# Patient Record
Sex: Female | Born: 1975 | Race: Black or African American | Hispanic: No | Smoking: Never smoker
Health system: Southern US, Community
[De-identification: ages and names within clinical notes are randomized; demographics above are authoritative.]

## PROBLEM LIST (undated history)

## (undated) DIAGNOSIS — N83209 Unspecified ovarian cyst, unspecified side: Secondary | ICD-10-CM

## (undated) DIAGNOSIS — R51 Headache: Secondary | ICD-10-CM

## (undated) DIAGNOSIS — K5900 Constipation, unspecified: Secondary | ICD-10-CM

## (undated) DIAGNOSIS — D649 Anemia, unspecified: Secondary | ICD-10-CM

## (undated) HISTORY — DX: Anemia, unspecified: D64.9

## (undated) HISTORY — PX: TUBAL LIGATION: SHX77

---

## 2010-11-24 ENCOUNTER — Encounter (HOSPITAL_COMMUNITY): Payer: Self-pay

## 2010-11-24 ENCOUNTER — Inpatient Hospital Stay (HOSPITAL_COMMUNITY)
Admission: AD | Admit: 2010-11-24 | Discharge: 2010-11-24 | Disposition: A | Discharge status: Home / Self Care | Payer: Self-pay | Source: Ambulatory Visit | Attending: Obstetrics & Gynecology | Admitting: Obstetrics & Gynecology

## 2010-11-24 ENCOUNTER — Inpatient Hospital Stay (HOSPITAL_COMMUNITY): Payer: Self-pay

## 2010-11-24 DIAGNOSIS — N939 Abnormal uterine and vaginal bleeding, unspecified: Secondary | ICD-10-CM

## 2010-11-24 DIAGNOSIS — N949 Unspecified condition associated with female genital organs and menstrual cycle: Secondary | ICD-10-CM | POA: Insufficient documentation

## 2010-11-24 DIAGNOSIS — N938 Other specified abnormal uterine and vaginal bleeding: Secondary | ICD-10-CM | POA: Insufficient documentation

## 2010-11-24 HISTORY — DX: Unspecified ovarian cyst, unspecified side: N83.209

## 2010-11-24 LAB — URINE MICROSCOPIC-ADD ON

## 2010-11-24 LAB — WET PREP, GENITAL
Trich, Wet Prep: NONE SEEN
Yeast Wet Prep HPF POC: NONE SEEN

## 2010-11-24 LAB — CBC
MCHC: 32.1 g/dL (ref 30.0–36.0)
RDW: 14.5 % (ref 11.5–15.5)

## 2010-11-24 LAB — URINALYSIS, ROUTINE W REFLEX MICROSCOPIC
Nitrite: NEGATIVE
Specific Gravity, Urine: 1.02 (ref 1.005–1.030)
Urobilinogen, UA: 2 mg/dL — ABNORMAL HIGH (ref 0.0–1.0)

## 2010-11-24 MED ORDER — MEDROXYPROGESTERONE ACETATE 10 MG PO TABS: 10.0000 mg | ORAL_TABLET | Freq: Every day | ORAL | Status: DC

## 2010-11-24 MED ORDER — NAPROXEN SODIUM 550 MG PO TABS: 550.0000 mg | ORAL_TABLET | Freq: Two times a day (BID) | ORAL | Status: AC

## 2010-11-24 NOTE — Progress Notes (Signed)
Pt states she had her normal period from 6-26 till 7-1. Started bleeding again on 7-15 and is heavier and brighter red blood than she normally has. Has had cramping with the bleeding. Changes tampons 4 times a day.

## 2010-11-24 NOTE — Progress Notes (Signed)
Pt reports have normal menses une 25- July 1 and now restarted bleeding on Sunday 15th and passing med size clots and cramping in low abd

## 2010-11-24 NOTE — ED Provider Notes (Addendum)
History    patient is a 35 year old black female who is a gravida 4 para 4. She does today complaining of low bowel pain and irregular vaginal bleeding. She states prior to this event that she has had normal and regular her menses. She states however at this time she started menses 2 weeks early. She also complains that the bleeding was extremely heavy and painful. She denies any vaginal discharge or irritation. She denies fever. She denies dysuria. She has no complaints this time.  Chief Complaint  Patient presents with  . Vaginal Bleeding   HPI  OB History    Grav Para Term Preterm Abortions TAB SAB Ect Mult Living   4 4 4       4       Past Medical History  Diagnosis Date  . Ovarian cyst     Past Surgical History  Procedure Date  . Tubal ligation     Family History  Problem Relation Age of Onset  . Asthma Mother   . Hypertension Mother     History  Substance Use Topics  . Smoking status: Never Smoker   . Smokeless tobacco: Not on file  . Alcohol Use: Yes    Allergies: No Known Allergies  Prescriptions prior to admission  Medication Sig Dispense Refill  . ibuprofen (ADVIL,MOTRIN) 200 MG tablet Take 400 mg by mouth every 6 (six) hours as needed. pain      . naproxen sodium (ANAPROX) 220 MG tablet Take 440 mg by mouth as needed. pain        Review of Systems  Constitutional: Negative for fever and chills.  Gastrointestinal: Positive for abdominal pain. Negative for nausea, vomiting, diarrhea and constipation.  Genitourinary: Negative for dysuria, urgency, frequency and hematuria.  Neurological: Negative for headaches.  Psychiatric/Behavioral: Negative for depression and suicidal ideas.   Physical Exam   Blood pressure 125/86, pulse 75, temperature 98.7 F (37.1 C), temperature source Oral, resp. rate 18, height 5\' 6"  (1.676 m), weight 182 lb 3.2 oz (82.645 kg), last menstrual period 11/03/2010, SpO2 99.00%.  Physical Exam  Constitutional: She is oriented to  person, place, and time. She appears well-developed and well-nourished. No distress.  HENT:  Head: Normocephalic and atraumatic.  GI: She exhibits no distension. There is no tenderness. There is no rebound and no guarding.  Genitourinary: There is bleeding around the vagina. No vaginal discharge found.       On examination there is moderate amount of bleeding in the vaginal vault. Cervix appears to be normal. Bimanual examination reveals normal size and shape uterus. No adnexal masses. She is nontender on examination.  Neurological: She is alert and oriented to person, place, and time.  Skin: Skin is warm and dry. She is not diaphoretic.  Psychiatric: She has a normal mood and affect. Her behavior is normal. Judgment and thought content normal.    MAU Course  Procedures  Results for orders placed during the hospital encounter of 11/24/10 (from the past 24 hour(s))  URINALYSIS, ROUTINE W REFLEX MICROSCOPIC     Status: Abnormal   Collection Time   11/24/10 12:55 PM      Component Value Range   Color, Urine YELLOW  YELLOW    Appearance CLOUDY (*) CLEAR    Specific Gravity, Urine 1.020  1.005 - 1.030    pH 7.0  5.0 - 8.0    Glucose, UA NEGATIVE  NEGATIVE (mg/dL)   Hgb urine dipstick LARGE (*) NEGATIVE    Bilirubin Urine NEGATIVE  NEGATIVE    Ketones, ur NEGATIVE  NEGATIVE (mg/dL)   Protein, ur NEGATIVE  NEGATIVE (mg/dL)   Urobilinogen, UA 2.0 (*) 0.0 - 1.0 (mg/dL)   Nitrite NEGATIVE  NEGATIVE    Leukocytes, UA NEGATIVE  NEGATIVE   URINE MICROSCOPIC-ADD ON     Status: Normal   Collection Time   11/24/10 12:55 PM      Component Value Range   Squamous Epithelial / LPF RARE  RARE    RBC / HPF TOO NUMEROUS TO COUNT  <3 (RBC/hpf)  CBC     Status: Abnormal   Collection Time   11/24/10  1:39 PM      Component Value Range   WBC 4.6  4.0 - 10.5 (K/uL)   RBC 3.85 (*) 3.87 - 5.11 (MIL/uL)   Hemoglobin 11.3 (*) 12.0 - 15.0 (g/dL)   HCT 40.9 (*) 81.1 - 46.0 (%)   MCV 91.4  78.0 - 100.0 (fL)     MCH 29.4  26.0 - 34.0 (pg)   MCHC 32.1  30.0 - 36.0 (g/dL)   RDW 91.4  78.2 - 95.6 (%)   Platelets 185  150 - 400 (K/uL)  WET PREP, GENITAL     Status: Normal   Collection Time   11/24/10  2:05 PM      Component Value Range   Yeast, Wet Prep NONE SEEN  NONE SEEN    Trich, Wet Prep NONE SEEN  NONE SEEN    Clue Cells, Wet Prep NONE SEEN  NONE SEEN    WBC, Wet Prep HPF POC NONE SEEN  NONE SEEN      Assessment and Plan  1) abnormal uterine bleeding: At this time we will treat her with Provera 10 mg to take 1 by mouth daily for 10 days. She'll followup with her regular GYN provider in [place]. Did discuss with her appropriate diet, activities, risks, and precautions. She understood and agreed.  Clinton Gallant. Marck Mcclenny III, DrHSc, MPAS, PA-C  11/24/2010, 3:29 PM   Henrietta Hoover, PA 12/04/10 [phone]

## 2010-11-24 NOTE — Progress Notes (Signed)
Patient is here with c/o vaginal bleeding. She states that she had a normal period june26th. She started bleeding again on Sunday. She states that it is heavier than usual (changing 4 tampons a day). She also c/o being thirsty and dizzy at times.

## 2011-03-17 ENCOUNTER — Other Ambulatory Visit (HOSPITAL_COMMUNITY)
Admission: RE | Admit: 2011-03-17 | Discharge: 2011-03-17 | Disposition: A | Discharge status: Home / Self Care | Payer: BC Managed Care – PPO | Source: Ambulatory Visit | Attending: Family Medicine | Admitting: Family Medicine

## 2011-03-17 DIAGNOSIS — Z0000 Encounter for general adult medical examination without abnormal findings: Secondary | ICD-10-CM | POA: Insufficient documentation

## 2011-03-17 DIAGNOSIS — Z113 Encounter for screening for infections with a predominantly sexual mode of transmission: Secondary | ICD-10-CM | POA: Insufficient documentation

## 2012-01-21 ENCOUNTER — Ambulatory Visit: Payer: BC Managed Care – PPO | Admitting: Family Medicine

## 2012-08-21 ENCOUNTER — Encounter (HOSPITAL_COMMUNITY): Payer: Self-pay | Admitting: *Deleted

## 2012-08-21 ENCOUNTER — Inpatient Hospital Stay (HOSPITAL_COMMUNITY)
Admission: AD | Admit: 2012-08-21 | Discharge: 2012-08-21 | Disposition: A | Discharge status: Home / Self Care | Payer: Self-pay | Source: Ambulatory Visit | Attending: Obstetrics & Gynecology | Admitting: Obstetrics & Gynecology

## 2012-08-21 DIAGNOSIS — R109 Unspecified abdominal pain: Secondary | ICD-10-CM | POA: Insufficient documentation

## 2012-08-21 DIAGNOSIS — K5900 Constipation, unspecified: Secondary | ICD-10-CM

## 2012-08-21 HISTORY — DX: Headache: R51

## 2012-08-21 HISTORY — DX: Constipation, unspecified: K59.00

## 2012-08-21 LAB — URINALYSIS, ROUTINE W REFLEX MICROSCOPIC
Glucose, UA: NEGATIVE mg/dL
Hgb urine dipstick: NEGATIVE
Specific Gravity, Urine: 1.015 (ref 1.005–1.030)

## 2012-08-21 LAB — POCT PREGNANCY, URINE: Preg Test, Ur: NEGATIVE

## 2012-08-21 MED ORDER — POLYETHYLENE GLYCOL 3350 17 G PO PACK: PACK | ORAL | Status: DC

## 2012-08-21 NOTE — MAU Note (Addendum)
Constipation X 3 months. Abdominal pain, bloating. Always "feel full." States she only has a BM when she takes a laxative. States her family doctor advised that she take miralax. Was referred to GI doctor and was told the same thing. States Miralax does not work. Feels nauseated and has HA throughout the day.

## 2012-08-21 NOTE — MAU Provider Note (Signed)
History     CSN: [id number]  Arrival date and time: 08/21/12 1144   None     Chief Complaint  Patient presents with  . Abdominal Pain  . Constipation   HPI Ongoing constipation x 3 months. Last BM 6 days ago after taking laxative tea. Has been evaluated by PCP and GI doctor. They told her to take Miralax, but she states that she took it q 30 minutes as directed, had diarrhea, then never took again because she didn't want to have diarrhea every day. Has follow up with GI doctor in four weeks.   Past Medical History  Diagnosis Date  . Ovarian cyst   . Constipation   . Headache     Past Surgical History  Procedure Laterality Date  . Tubal ligation      Family History  Problem Relation Age of Onset  . Asthma Mother   . Hypertension Mother     History  Substance Use Topics  . Smoking status: Never Smoker   . Smokeless tobacco: Not on file  . Alcohol Use: Yes     Comment: Occas.    Allergies: No Known Allergies  Prescriptions prior to admission  Medication Sig Dispense Refill  . ibuprofen (ADVIL,MOTRIN) 200 MG tablet Take 400 mg by mouth every 6 (six) hours as needed. pain      . medroxyPROGESTERone (PROVERA) 10 MG tablet Take 1 tablet (10 mg total) by mouth daily.  10 tablet  0  . naproxen sodium (ANAPROX) 220 MG tablet Take 440 mg by mouth as needed. pain        Review of Systems  Constitutional: Negative.   Respiratory: Negative.   Cardiovascular: Negative.   Gastrointestinal: Positive for abdominal pain and constipation. Negative for nausea, vomiting and diarrhea.       + bloating   Genitourinary: Negative for dysuria, urgency, frequency, hematuria and flank pain.       Negative for vaginal bleeding, vaginal discharge, dyspareunia  Musculoskeletal: Negative.   Neurological: Negative.   Psychiatric/Behavioral: Negative.    Physical Exam   Blood pressure 126/73, pulse 82, temperature 98.2 F (36.8 C), temperature source Oral, resp. rate 18, height 5'  6" (1.676 m), weight 219 lb (99.338 kg), last menstrual period 07/25/2012, SpO2 100.00%.  Physical Exam  Nursing note and vitals reviewed. Constitutional: She is oriented to person, place, and time. She appears well-developed and well-nourished. No distress.  Cardiovascular: Normal rate.   Respiratory: Effort normal.  Musculoskeletal: Normal range of motion.  Neurological: She is alert and oriented to person, place, and time.  Skin: Skin is warm and dry.  Psychiatric: She has a normal mood and affect.    MAU Course  Procedures Results for orders placed during the hospital encounter of 08/21/12 (from the past 24 hour(s))  URINALYSIS, ROUTINE W REFLEX MICROSCOPIC     Status: Abnormal   Collection Time    08/21/12 11:58 AM      Result Value Range   Color, Urine YELLOW  YELLOW   APPearance CLEAR  CLEAR   Specific Gravity, Urine 1.015  1.005 - 1.030   pH 6.5  5.0 - 8.0   Glucose, UA NEGATIVE  NEGATIVE mg/dL   Hgb urine dipstick NEGATIVE  NEGATIVE   Bilirubin Urine NEGATIVE  NEGATIVE   Ketones, ur NEGATIVE  NEGATIVE mg/dL   Protein, ur NEGATIVE  NEGATIVE mg/dL   Urobilinogen, UA 2.0 (*) 0.0 - 1.0 mg/dL   Nitrite NEGATIVE  NEGATIVE   Leukocytes, UA NEGATIVE  NEGATIVE  POCT PREGNANCY, URINE     Status: None   Collection Time    08/21/12 12:05 PM      Result Value Range   Preg Test, Ur NEGATIVE  NEGATIVE     Assessment and Plan  Chronic constipation - ongoing issue, has GI follow up, recommended taking Miralax regularly (may take today q 30 min until BM, then BID after that). Increase water, fresh fruits and vegetables, may add fiber supplement. Follow up with PCP/GI.  No emergent medical or OB/GYN concerns today.   Kamaal Cast 08/21/2012, 12:20 PM

## 2012-08-21 NOTE — MAU Provider Note (Signed)
Attestation of Attending Supervision of Advanced Practitioner (CNM/NP): Evaluation and management procedures were performed by the Advanced Practitioner under my supervision and collaboration. I have reviewed the Advanced Practitioner's note and chart, and I agree with the management and plan.  Jakaiya Netherland H. 9:51 PM   

## 2012-08-30 ENCOUNTER — Emergency Department (HOSPITAL_COMMUNITY): Payer: Self-pay

## 2012-08-30 ENCOUNTER — Encounter (HOSPITAL_COMMUNITY): Payer: Self-pay | Admitting: *Deleted

## 2012-08-30 ENCOUNTER — Emergency Department (HOSPITAL_COMMUNITY)
Admission: EM | Admit: 2012-08-30 | Discharge: 2012-08-30 | Disposition: A | Discharge status: Home / Self Care | Payer: Self-pay | Attending: Emergency Medicine | Admitting: Emergency Medicine

## 2012-08-30 DIAGNOSIS — R110 Nausea: Secondary | ICD-10-CM | POA: Insufficient documentation

## 2012-08-30 DIAGNOSIS — R51 Headache: Secondary | ICD-10-CM | POA: Insufficient documentation

## 2012-08-30 DIAGNOSIS — R109 Unspecified abdominal pain: Secondary | ICD-10-CM

## 2012-08-30 DIAGNOSIS — Z3202 Encounter for pregnancy test, result negative: Secondary | ICD-10-CM | POA: Insufficient documentation

## 2012-08-30 DIAGNOSIS — Z9851 Tubal ligation status: Secondary | ICD-10-CM | POA: Insufficient documentation

## 2012-08-30 DIAGNOSIS — Z8742 Personal history of other diseases of the female genital tract: Secondary | ICD-10-CM | POA: Insufficient documentation

## 2012-08-30 DIAGNOSIS — R1084 Generalized abdominal pain: Secondary | ICD-10-CM | POA: Insufficient documentation

## 2012-08-30 DIAGNOSIS — R5381 Other malaise: Secondary | ICD-10-CM | POA: Insufficient documentation

## 2012-08-30 DIAGNOSIS — K5900 Constipation, unspecified: Secondary | ICD-10-CM | POA: Insufficient documentation

## 2012-08-30 LAB — URINALYSIS, ROUTINE W REFLEX MICROSCOPIC
Bilirubin Urine: NEGATIVE
Hgb urine dipstick: NEGATIVE
Nitrite: NEGATIVE
Protein, ur: NEGATIVE mg/dL
Urobilinogen, UA: 0.2 mg/dL (ref 0.0–1.0)

## 2012-08-30 LAB — BASIC METABOLIC PANEL
CO2: 27 mEq/L (ref 19–32)
Chloride: 104 mEq/L (ref 96–112)
Creatinine, Ser: 0.93 mg/dL (ref 0.50–1.10)
GFR calc Af Amer: 90 mL/min — ABNORMAL LOW (ref >90)
Potassium: 4.1 mEq/L (ref 3.5–5.1)

## 2012-08-30 LAB — CBC WITH DIFFERENTIAL/PLATELET
Basophils Absolute: 0 10*3/uL (ref 0.0–0.1)
HCT: 35.8 % — ABNORMAL LOW (ref 36.0–46.0)
Hemoglobin: 11.6 g/dL — ABNORMAL LOW (ref 12.0–15.0)
Lymphocytes Relative: 27 % (ref 12–46)
Monocytes Absolute: 0.4 10*3/uL (ref 0.1–1.0)
Monocytes Relative: 7 % (ref 3–12)
Neutro Abs: 3.8 10*3/uL (ref 1.7–7.7)
RDW: 14.8 % (ref 11.5–15.5)
WBC: 5.8 10*3/uL (ref 4.0–10.5)

## 2012-08-30 LAB — PREGNANCY, URINE: Preg Test, Ur: NEGATIVE

## 2012-08-30 MED ORDER — IOHEXOL 300 MG/ML  SOLN
100.0000 mL | Freq: Once | INTRAMUSCULAR | Status: AC | PRN
  Administered 2012-08-30: 100 mL via INTRAVENOUS

## 2012-08-30 MED ORDER — LACTULOSE 10 GM/15ML PO SOLN: ORAL | Status: DC

## 2012-08-30 MED ORDER — IOHEXOL 300 MG/ML  SOLN
50.0000 mL | Freq: Once | INTRAMUSCULAR | Status: AC | PRN
  Administered 2012-08-30: 50 mL via ORAL

## 2012-08-30 NOTE — ED Notes (Signed)
Pt sts nausea and abdominal pain x 2 months.  Sts she hasn't been able to eat well, due to feeling "full."  Sts last BM x 4 days ago, after taking a laxative.

## 2012-08-30 NOTE — ED Provider Notes (Signed)
History     CSN: [id number]  Arrival date & time 08/30/12  1191   First MD Initiated Contact with Patient 08/30/12 1036      Chief Complaint  Patient presents with  . Abdominal Pain  . Nausea  . Headache  . Weakness    (Consider location/radiation/quality/duration/timing/severity/associated sxs/prior treatment) HPI Comments: Charlene Mccullough is a 37 y.o. female who presents for evaluation of persistent abdominal pain and constipation. There is intermittent over the last several months. She is seeing her doctor and has been diagnosed with constipation. She's been using MiraLax, without relief. She has mild nausea without vomiting. There's been no fever, or bloody stools. She denies back pain, weakness, or dizziness. She has not tried any medications. Recently. She has no other complaints. There are no known modifying factors  Patient is a 37 y.o. female presenting with abdominal pain, headaches, and weakness. The history is provided by the patient.  Abdominal Pain Headache Associated symptoms: abdominal pain   Weakness Associated symptoms include abdominal pain and headaches.    Past Medical History  Diagnosis Date  . Ovarian cyst   . Constipation   . Headache     Past Surgical History  Procedure Laterality Date  . Tubal ligation      Family History  Problem Relation Age of Onset  . Asthma Mother   . Hypertension Mother     History  Substance Use Topics  . Smoking status: Never Smoker   . Smokeless tobacco: Never Used  . Alcohol Use: Yes     Comment: Occas.    OB History   Grav Para Term Preterm Abortions TAB SAB Ect Mult Living   4 4 4       4       Review of Systems  Gastrointestinal: Positive for abdominal pain.  Neurological: Positive for weakness and headaches.  All other systems reviewed and are negative.    Allergies  Review of patient's allergies indicates no known allergies.  Home Medications   Current Outpatient Rx  Name  Route  Sig   Dispense  Refill  . Vitamin D, Ergocalciferol, (DRISDOL) 50000 UNITS CAPS   Oral   Take 50,000 Units by mouth every 7 (seven) days. mondays         . lactulose (CHRONULAC) 10 GM/15ML solution      15-30 cc 1 or 2 times a day for constipation.   960 mL   0     BP 120/84  Pulse 78  Temp(Src) 98 F (36.7 C) (Oral)  Resp 20  Wt 218 lb (98.884 kg)  BMI 35.2 kg/m2  SpO2 100%  LMP 08/26/2012  Physical Exam  Nursing note and vitals reviewed. Constitutional: She is oriented to person, place, and time. She appears well-developed and well-nourished.  HENT:  Head: Normocephalic and atraumatic.  Eyes: Conjunctivae and EOM are normal. Pupils are equal, round, and reactive to light.  Neck: Normal range of motion and phonation normal. Neck supple.  Cardiovascular: Normal rate, regular rhythm and intact distal pulses.   Pulmonary/Chest: Effort normal and breath sounds normal. She exhibits no tenderness.  Abdominal: Soft. She exhibits no distension and no mass. There is tenderness (mild diffuse tenderness). There is no rebound and no guarding.  Musculoskeletal: Normal range of motion.  Neurological: She is alert and oriented to person, place, and time. She has normal strength. She exhibits normal muscle tone.  Skin: Skin is warm and dry.  Psychiatric: She has a normal mood and affect. Her behavior  is normal. Judgment and thought content normal.    ED Course  Procedures (including critical care time)  Medications  iohexol (OMNIPAQUE) 300 MG/ML solution 50 mL (50 mLs Oral Contrast Given 08/30/12 1301)  iohexol (OMNIPAQUE) 300 MG/ML solution 100 mL (100 mLs Intravenous Contrast Given 08/30/12 1346)    12:25-KUB, abnormal, CT abdomen and pelvis ordered to rule out complicating factors associated with free  surgical clip coming in the abdomen  Reevaluation: 15:30- states that she feels better. She has voided after the CAT scan. Scan ordered to evaluate for urinary retention. Scan showed 107  ml   Labs Reviewed  CBC WITH DIFFERENTIAL - Abnormal; Notable for the following:    Hemoglobin 11.6 (*)    HCT 35.8 (*)    All other components within normal limits  BASIC METABOLIC PANEL - Abnormal; Notable for the following:    GFR calc non Af Amer 78 (*)    GFR calc Af Amer 90 (*)    All other components within normal limits  URINALYSIS, ROUTINE W REFLEX MICROSCOPIC  AMYLASE  LIPASE, BLOOD  PREGNANCY, URINE   Dg Abd 1 View  08/30/2012  *RADIOLOGY REPORT*  Clinical Data: Abdominal pain.  ABDOMEN - 1 VIEW  Comparison: None.  Findings: Bowel gas pattern is unremarkable.  Tubal ligation clip is seen in the left hemi pelvis.  A second clip is identified in the right upper quadrant of the abdomen.  No focal bony abnormality.  IMPRESSION:  1.  No acute finding. 2.  Tubal ligation clip on the right appears to have become dislodged and is now located in the right upper quadrant of the abdomen.   Original Report Authenticated By: Holley Dexter, M.D.    Ct Abdomen Pelvis W Contrast  08/30/2012  *RADIOLOGY REPORT*  Clinical Data: Lower abdominal pain.  CT ABDOMEN AND PELVIS WITH CONTRAST  Technique:  Multidetector CT imaging of the abdomen and pelvis was performed following the standard protocol during bolus administration of intravenous contrast.  Contrast: 50mL OMNIPAQUE IOHEXOL 300 MG/ML  SOLN, OMNIPAQUE IOHEXOL 300 MG/ML  SOLN  Comparison: Radiographs 08/30/2012.  Findings: The lung bases are clear.  No pleural effusion or pulmonary nodule.  The heart is within normal limits in size.  No pericardial effusion.  The distal esophagus is unremarkable.  The liver is unremarkable.  No focal hepatic lesions or intrahepatic biliary dilatation.  The gallbladder is normal.  No common bile duct dilatation.  The pancreas is normal.  The spleen is normal.  The adrenal glands are normal.  There is mild to moderate bilateral hydroureteronephrosis without obvious cause.  No obstructing ureteral calculi.  No  bladder calculi.  No compressive mass or adenopathy.  The bladder is mildly distended.  No mesenteric or retroperitoneal mass or adenopathy.  The aorta is normal.  The branch vessels are normal.  The stomach, duodenum, small bowel and colon are unremarkable.  No inflammatory changes or mass lesions.  The appendix is normal.  The uterus and ovaries are normal.  No pelvic mass, adenopathy or free pelvic fluid collections.  No inguinal mass or hernia. There is a tubal ligation clip noted near the left ovary.  The right tubal ligation clip is noted in the lower right abdomen near the inferior tip of the liver.  The bony structures are unremarkable.  IMPRESSION:  1.  No acute abdominal/pelvic findings, mass lesions or adenopathy. 2.  Bilateral hydroureteronephrosis and mildly dilated bladder.  No ureteral obstruction is identified. 3.  Right tubal  ligation clip has migrated into the lower right abdomen.  It is in the paracolic gutter along the inferior tip of the liver.   Original Report Authenticated By: Rudie Meyer, M.D.    Nursing Notes Reviewed/ Care Coordinated, and agree without changes. Applicable Imaging Reviewed.  Interpretation of Laboratory Data incorporated into ED treatment  1. Abdominal pain   2. Constipation       MDM  Nonspecific abdominal pain, with constipation. Incidental finding of a migrated surgical clip from the right BTL. This could indicate failure of the Ligation site. Patient was informed of this finding verbally, vitamin E, and instructed that she would be at risk for pregnancy if she had unprotected intercourse. Doubt metabolic instability, serious bacterial infection or impending vascular collapse; the patient is stable for discharge.    Plan: Home Medications- lactulose for constipation; Home Treatments- rest, fluids; Recommended follow up- GYN followup regarding migrated BTL surgical clip. Protected sex, to avoid pregnancy       Flint Melter, MD 08/30/12 [phone]

## 2012-08-30 NOTE — ED Notes (Signed)
Bladder scan showed 107 mL urine; pt up to urinate after bladder scan.

## 2012-08-30 NOTE — ED Notes (Signed)
Pt from home with reports of intermittent abdominal pain that is worse at night as well as general weakness, headaches and nausea for the last 2 months.

## 2012-08-30 NOTE — Progress Notes (Signed)
Pt confirms pcp is Writer  EPIC updated

## 2012-08-30 NOTE — ED Notes (Signed)
[id number]<AV> Expected date:<BR> Expected time:<BR> Means of arrival:<BR> Comments:<BR> Hold for triage

## 2012-10-26 ENCOUNTER — Encounter (HOSPITAL_COMMUNITY): Payer: Self-pay | Admitting: Emergency Medicine

## 2012-10-26 ENCOUNTER — Emergency Department (HOSPITAL_COMMUNITY)
Admission: EM | Admit: 2012-10-26 | Discharge: 2012-10-26 | Disposition: A | Discharge status: Home / Self Care | Payer: No Typology Code available for payment source | Attending: Emergency Medicine | Admitting: Emergency Medicine

## 2012-10-26 DIAGNOSIS — S4980XA Other specified injuries of shoulder and upper arm, unspecified arm, initial encounter: Secondary | ICD-10-CM | POA: Insufficient documentation

## 2012-10-26 DIAGNOSIS — Y92410 Unspecified street and highway as the place of occurrence of the external cause: Secondary | ICD-10-CM | POA: Insufficient documentation

## 2012-10-26 DIAGNOSIS — Z8742 Personal history of other diseases of the female genital tract: Secondary | ICD-10-CM | POA: Insufficient documentation

## 2012-10-26 DIAGNOSIS — Y9389 Activity, other specified: Secondary | ICD-10-CM | POA: Insufficient documentation

## 2012-10-26 DIAGNOSIS — S46909A Unspecified injury of unspecified muscle, fascia and tendon at shoulder and upper arm level, unspecified arm, initial encounter: Secondary | ICD-10-CM | POA: Insufficient documentation

## 2012-10-26 DIAGNOSIS — S139XXA Sprain of joints and ligaments of unspecified parts of neck, initial encounter: Secondary | ICD-10-CM | POA: Insufficient documentation

## 2012-10-26 DIAGNOSIS — S161XXA Strain of muscle, fascia and tendon at neck level, initial encounter: Secondary | ICD-10-CM

## 2012-10-26 MED ORDER — NAPROXEN 500 MG PO TABS: 500.0000 mg | ORAL_TABLET | Freq: Two times a day (BID) | ORAL | Status: DC | PRN

## 2012-10-26 MED ORDER — METHOCARBAMOL 500 MG PO TABS
500.0000 mg | ORAL_TABLET | Freq: Once | ORAL | Status: AC
  Administered 2012-10-26: 500 mg via ORAL
  Filled 2012-10-26: qty 1

## 2012-10-26 MED ORDER — IBUPROFEN 800 MG PO TABS
800.0000 mg | ORAL_TABLET | Freq: Once | ORAL | Status: AC
  Administered 2012-10-26: 800 mg via ORAL
  Filled 2012-10-26: qty 1

## 2012-10-26 MED ORDER — METHOCARBAMOL 750 MG PO TABS: 750.0000 mg | ORAL_TABLET | Freq: Four times a day (QID) | ORAL | Status: DC | PRN
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
## 2012-10-26 NOTE — ED Provider Notes (Signed)
Medical screening examination/treatment/procedure(s) were performed by non-physician practitioner and as supervising physician I was immediately available for consultation/collaboration.  Lyanne Co, MD 10/26/12 [phone]

## 2013-05-28 ENCOUNTER — Ambulatory Visit (INDEPENDENT_AMBULATORY_CARE_PROVIDER_SITE_OTHER): Payer: No Typology Code available for payment source | Admitting: Gynecology

## 2013-05-28 ENCOUNTER — Encounter: Payer: Self-pay | Admitting: Gynecology

## 2013-05-28 ENCOUNTER — Other Ambulatory Visit (HOSPITAL_COMMUNITY)
Admission: RE | Admit: 2013-05-28 | Discharge: 2013-05-28 | Disposition: A | Discharge status: Home / Self Care | Payer: No Typology Code available for payment source | Source: Ambulatory Visit | Attending: Gynecology | Admitting: Gynecology

## 2013-05-28 VITALS — BP 124/82 | Ht 66.0 in | Wt 226.0 lb

## 2013-05-28 DIAGNOSIS — Z1151 Encounter for screening for human papillomavirus (HPV): Secondary | ICD-10-CM | POA: Insufficient documentation

## 2013-05-28 DIAGNOSIS — Z309 Encounter for contraceptive management, unspecified: Secondary | ICD-10-CM

## 2013-05-28 DIAGNOSIS — Z01419 Encounter for gynecological examination (general) (routine) without abnormal findings: Secondary | ICD-10-CM

## 2013-05-28 DIAGNOSIS — Z23 Encounter for immunization: Secondary | ICD-10-CM

## 2013-05-28 DIAGNOSIS — N920 Excessive and frequent menstruation with regular cycle: Secondary | ICD-10-CM | POA: Insufficient documentation

## 2013-05-28 LAB — CBC WITH DIFFERENTIAL/PLATELET
Basophils Absolute: 0 10*3/uL (ref 0.0–0.1)
Basophils Relative: 0 % (ref 0–1)
EOS ABS: 0.1 10*3/uL (ref 0.0–0.7)
EOS PCT: 1 % (ref 0–5)
HEMATOCRIT: 34.4 % — AB (ref 36.0–46.0)
Hemoglobin: 10.7 g/dL — ABNORMAL LOW (ref 12.0–15.0)
LYMPHS ABS: 2.4 10*3/uL (ref 0.7–4.0)
LYMPHS PCT: 29 % (ref 12–46)
MCH: 25.2 pg — AB (ref 26.0–34.0)
MCHC: 31.1 g/dL (ref 30.0–36.0)
MCV: 81.1 fL (ref 78.0–100.0)
MONO ABS: 0.8 10*3/uL (ref 0.1–1.0)
Monocytes Relative: 10 % (ref 3–12)
Neutro Abs: 5 10*3/uL (ref 1.7–7.7)
Neutrophils Relative %: 60 % (ref 43–77)
PLATELETS: 280 10*3/uL (ref 150–400)
RBC: 4.24 MIL/uL (ref 3.87–5.11)
RDW: 16.7 % — ABNORMAL HIGH (ref 11.5–15.5)
WBC: 8.3 10*3/uL (ref 4.0–10.5)

## 2013-05-28 LAB — COMPREHENSIVE METABOLIC PANEL
ALBUMIN: 4.1 g/dL (ref 3.5–5.2)
ALT: 11 U/L (ref 0–35)
AST: 17 U/L (ref 0–37)
Alkaline Phosphatase: 67 U/L (ref 39–117)
BUN: 16 mg/dL (ref 6–23)
CALCIUM: 8.8 mg/dL (ref 8.4–10.5)
CHLORIDE: 103 meq/L (ref 96–112)
CO2: 25 meq/L (ref 19–32)
CREATININE: 0.79 mg/dL (ref 0.50–1.10)
GLUCOSE: 70 mg/dL (ref 70–99)
POTASSIUM: 4.1 meq/L (ref 3.5–5.3)
Sodium: 138 mEq/L (ref 135–145)
Total Bilirubin: 0.3 mg/dL (ref 0.3–1.2)
Total Protein: 6.9 g/dL (ref 6.0–8.3)

## 2013-05-28 LAB — CHOLESTEROL, TOTAL: Cholesterol: 176 mg/dL (ref 0–200)

## 2013-05-28 MED ORDER — DOXYCYCLINE HYCLATE 100 MG PO CAPS
100.0000 mg | ORAL_CAPSULE | Freq: Two times a day (BID) | ORAL | Status: DC
Start: 2013-05-28 — End: 2013-06-28

## 2013-05-28 NOTE — Addendum Note (Signed)
Addended by: Su Grand A on: 05/28/2013 03:27 PM   Modules accepted: Orders

## 2013-05-28 NOTE — Progress Notes (Addendum)
Charlene Mccullough [birth year] [id number]   History:    38 y.o.  for annual gyn exam was patient in the practice. Patient stated she has not had a Pap smear in over 2 years. She denies any past history of abnormal Pap smears or any STDs. Patient has not been using any form of contraception although she stated several years ago in [place] she had a laparoscopic tubal ligation. In April 2014 she had gone to the emergency room complaining of lower, pain and had a CT scan which essentially had only demonstrated bilateral hydro-utero nephrosis and mildly dilated bladder but no ureteral obstruction. An incidental finding and demonstrated that a right tubal ligation clip had migrated to the lower right abdomen it was in the paracolic gutter along the inferior tip of the liver. The left clip was noted near the ovary. Patient is concerned about her weight gain. She also suffers from heavy cycles with passage of large clots but no intermenstrual bleeding.  Past medical history,surgical history, family history and social history were all reviewed and documented in the EPIC chart.  Gynecologic History Patient's last menstrual period was 05/02/2013. Contraception: tubal ligation Last Pap: Over 2 years ago. Results were: Patient stated normal Last mammogram: Not indicated. Results were: Not indicated  Obstetric History OB History  Gravida Para Term Preterm AB SAB TAB Ectopic Multiple Living  4 4 4       4     # Outcome Date GA Lbr Len/2nd Weight Sex Delivery Anes PTL Lv  4 TRM           3 TRM           2 TRM           1 TRM                ROS: A ROS was performed and pertinent positives and negatives are included in the history.  GENERAL: No fevers or chills. HEENT: No change in vision, no earache, sore throat or sinus congestion. NECK: No pain or stiffness. CARDIOVASCULAR: No chest pain or pressure. No palpitations. PULMONARY: No shortness of breath, cough or wheeze. GASTROINTESTINAL: No abdominal  pain, nausea, vomiting or diarrhea, melena or bright red blood per rectum. GENITOURINARY: No urinary frequency, urgency, hesitancy or dysuria. MUSCULOSKELETAL: No joint or muscle pain, no back pain, no recent trauma. DERMATOLOGIC: No rash, no itching, no lesions. ENDOCRINE: No polyuria, polydipsia, no heat or cold intolerance. No recent change in weight. HEMATOLOGICAL: No anemia or easy bruising or bleeding. NEUROLOGIC: No headache, seizures, numbness, tingling or weakness. PSYCHIATRIC: No depression, no loss of interest in normal activity or change in sleep pattern.     Exam: chaperone present  BP 124/82  Ht 5\' 6"  (1.676 m)  Wt 226 lb (102.513 kg)  BMI 36.49 kg/m2  LMP 05/02/2013  Body mass index is 36.49 kg/(m^2).  General appearance : Well developed well nourished female. No acute distress HEENT: Neck supple, trachea midline, no carotid bruits, no thyroidmegaly Lungs: Clear to auscultation, no rhonchi or wheezes, or rib retractions  Heart: Regular rate and rhythm, no murmurs or gallops Breast:Examined in sitting and supine position were symmetrical in appearance, no palpable masses or tenderness,  no skin retraction, no nipple inversion, no nipple discharge, no skin discoloration, no axillary or supraclavicular lymphadenopathy Abdomen: no palpable masses or tenderness, no rebound or guarding Extremities: no edema or skin discoloration or tenderness  Pelvic:  Bartholin, Urethra, Skene Glands: Within normal limits  Vagina: No gross lesions or discharge  Cervix: No gross lesions or discharge  Uterus  anteverted upper limits of normal, normal size, shape and consistency, non-tender and mobile  Adnexa  Without masses or tenderness  Anus and perineum  normal   Rectovaginal  normal sphincter tone without palpated masses or tenderness             Hemoccult not indicated     Assessment/Plan:  38 y.o. female for annual exam who is concerned about her weight gain and wanted to go  under some form of treatment to help her reduced weight. We discussed the importance of regular exercise as well as healthy nutrition. We will check her TSH today along with a comprehensive metabolic panel CBC screen cholesterol and urinalysis. A Pap smear with HPV screen will be done today. 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 We did discuss today the Mirena IUD not only for contraception but her cycle control and literature and information was provided. She will be placed on Vibramycin 100 mg twice a day for 3 days starting the day before the HSG. She'll return back to the office in one month for followup meanwhile she will use barrier contraception. We will hold off on starting her on phentermine until we know that she is sterile or is using some form of contraception like Mirena IUD. Patient received the flu vaccine today.  Note: This dictation was prepared with  Dragon/digital dictation along withSmart phrase technology. Any transcriptional errors that result from this process are unintentional.   Terrance Mass MD, 3:02 PM 05/28/2013

## 2013-05-28 NOTE — Patient Instructions (Signed)
Influenza Vaccine (Flu Vaccine, Inactivated or Recombinant) 2014 2015 What You Need to Know Why get vaccinated? Influenza ("flu") is a contagious disease that spreads around the [place] every winter, usually between October and May. Flu is caused by influenza viruses, and is spread mainly by coughing, sneezing, and close contact. Anyone can get flu, but the risk of getting flu is highest among children. Symptoms come on suddenly and may last several days. They can include:  fever/chills  sore throat  muscle aches  fatigue.  cough  headache  runny or stuffy nose Flu can make some people much sicker than others. These people include young children, people 42 and older, pregnant women, and people with certain health conditions such as heart, lung or kidney disease, nervous system disorders, or a weakened immune system. Flu vaccination is especially important for these people, and anyone in close contact with them. Flu can also lead to pneumonia, and make existing medical conditions worse. It can cause diarrhea and seizures in children. Each year thousands of people in the [place] die from flu, and many more are hospitalized. Flu vaccine is the best protection against flu and its complications. Flu vaccine also helps prevent spreading flu from person to person. Inactivated and recombinant flu vaccines You are getting an injectable flu vaccine, which is either an "inactivated" or "recombinant" vaccine. These vaccines do not contain any live influenza virus. They are given by injection with a needle, and often called the "flu shot."  A different live, attenuated (weakened) influenza vaccine is sprayed into the nostrils. This vaccine is described in a separate Vaccine Information Statement. Flu vaccination is recommended every year. Some children 6 months through 36 years of age might need two doses during one year. Flu viruses are always changing. Each year's flu vaccine is made to  protect against 3 or 4 viruses that are likely to cause disease that year. Flu vaccine cannot prevent all cases of flu, but it is the best defense against the disease.  It takes about 2 weeks for protection to develop after the vaccination, and protection lasts several months to a year. Some illnesses that are not caused by influenza virus are often mistaken for flu. Flu vaccine will not prevent these illnesses. It can only prevent influenza. Some inactivated flu vaccine contains a very small amount of a mercury-based preservative called thimerosal. Studies have shown that thimerosal in vaccines is not harmful, but flu vaccines that do not contain a preservative are available. Some people should not get this vaccine Tell the person who gives you the vaccine:  If you have any severe, life-threatening allergies. If you ever had a life-threatening allergic reaction after a dose of flu vaccine, or have a severe allergy to any part of this vaccine, including (for example) an allergy to gelatin, antibiotics, or eggs, you may be advised not to get vaccinated. Most, but not all, types of flu vaccine contain a small amount of egg protein.  If you ever had Guillain-Barr Syndrome (a severe paralyzing illness, also called GBS). Some people with a history of GBS should not get this vaccine. This should be discussed with your doctor.  If you are not feeling well. It is usually okay to get flu vaccine when you have a mild illness, but you might be advised to wait until you feel better. You should come back when you are better. Risks of a vaccine reaction With a vaccine, like any medicine, there is a chance of side effects. These  are usually mild and go away on their own. Problems that could happen after any vaccine:  Brief fainting spells can happen after any medical procedure, including vaccination. Sitting or lying down for about 15 minutes can help prevent fainting, and injuries caused by a fall. Tell your  doctor if you feel dizzy, or have vision changes or ringing in the ears.  Severe shoulder pain and reduced range of motion in the arm where a shot was given can happen, very rarely, after a vaccination.  Severe allergic reactions from a vaccine are very rare, estimated at less than 1 in a million doses. If one were to occur, it would usually be within a few minutes to a few hours after the vaccination. Mild problems following inactivated flu vaccine:  soreness, redness, or swelling where the shot was given  hoarseness  sore, red or itchy eyes  cough  fever  aches  headache  itching  fatigue If these problems occur, they usually begin soon after the shot and last 1 or 2 days. Moderate problems following inactivated flu vaccine:  Young children who get inactivated flu vaccine and pneumococcal vaccine (PCV13) at the same time may be at increased risk for seizures caused by fever. Ask your doctor for more information. Tell your doctor if a child who is getting flu vaccine has ever had a seizure. Inactivated flu vaccine does not contain live flu virus, so you cannot get the flu from this vaccine. As with any medicine, there is a very remote chance of a vaccine causing a serious injury or death. The safety of vaccines is always being monitored. For more information, visit: http://www.aguilar.org/ What if there is a serious reaction? What should I look for?  Look for anything that concerns you, such as signs of a severe allergic reaction, very high fever, or behavior changes. Signs of a severe allergic reaction can include hives, swelling of the face and throat, difficulty breathing, a fast heartbeat, dizziness, and weakness. These would start a few minutes to a few hours after the vaccination. What should I do?  If you think it is a severe allergic reaction or other emergency that can't wait, call 9 1 1  and get the person to the nearest hospital. Otherwise, call your  doctor.  Afterward, the reaction should be reported to the Vaccine Adverse Event Reporting System (VAERS). Your doctor should file this report, or you can do it yourself through the VAERS website at www.vaers.SamedayNews.es, or by calling (519)727-0280. VAERS does not give medical advice. The National Vaccine Injury Compensation Program The National Vaccine Injury Compensation Program (VICP) is a federal program that was created to compensate people who may have been injured by certain vaccines. Persons who believe they may have been injured by a vaccine can learn about the program and about filing a claim by calling 731 325 7673 or visiting the [place] website at GoldCloset.com.ee. There is a time limit to file a claim for compensation. How can I learn more?  Ask your health care provider.  Call your local or state health department.  Contact the Centers for Disease Control and Prevention (CDC):  Call 520-499-5291 (1-800-CDC-INFO) or  Visit CDC's website at https://gibson.com/ CDC Vaccine Information Statement (Interim) Inactivated Influenza Vaccine (12/26/2012) Document Released: 02/18/2006 Document Revised: 02/14/2013 Document Reviewed: 12/26/2012 [facility] Patient Information 2014 [place]. Hysterosalpingography Hysterosalpingography is a procedure to look inside your uterus and fallopian tubes. During this procedure, contrast dye is injected into your uterus through your vagina and cervix to illuminate your  uterus while X-ray pictures are taken. This procedure may help your health care provider determine whether you have uterine tumors, adhesions, or structural abnormalities. It is commonly used to help determine why a woman is unable to have children (infertility). The procedure usually lasts about 15 30 minutes. LET [facility] CARE PROVIDER KNOW ABOUT:  Any allergies you have.  All medicines you are taking, including vitamins, herbs, eye drops, creams, and  over-the-counter medicines.  Previous problems you or members of your family have had with the use of anesthetics.  Any blood disorders you have.  Previous surgeries you have had.  Medical conditions you have. RISKS AND COMPLICATIONS  Generally, this is a safe procedure. However, as with any procedure, complications can occur. Possible complications include:  Infection in the lining of the uterus (endometritis) or fallopian tubes (salpingitis).  Damage or perforation of the uterus or fallopian tubes.  An allergic reaction to the contrast dye used to perform the X-ray. BEFORE THE PROCEDURE   Schedule the procedure after your period stops, but before your next ovulation. This is usually between day 5 and day 10 of your last period. Day 1 is the first day of your period.  Ask your health care provider about changing or stopping your regular medicines.  You may eat and drink as normal.  Empty your bladder before the procedure begins. PROCEDURE  You may be given a medicine to relax you (sedative) or an over-the-counter pain medicine to lessen any discomfort during the procedure.  You will lie down on an X-ray table with your feet in stirrups.  A device called a speculum will be placed into your vagina. This allows your health care provider to see inside your vagina to the cervix.  The cervix will be washed with a special soap.  A thin, flexible tube will be passed through the cervix into the uterus.  Contrast dye will be put into this tube.  Several X-rays will be taken as the contrast dye spreads through the uterus and fallopian tubes.  The tube will be taken out after the procedure. AFTER THE PROCEDURE   Most of the contrast dye will flow out of your vagina naturally. You may want to wear a sanitary pad.  You may feel mild cramping.  Ask when your test results will be ready. Make sure you get your test results. Document Released: 05/29/2004 Document Revised: 12/27/2012  Document Reviewed: 10/27/2012 [facility] Patient Information 2014 [place].

## 2013-05-29 ENCOUNTER — Other Ambulatory Visit: Payer: Self-pay | Admitting: Gynecology

## 2013-05-29 DIAGNOSIS — D649 Anemia, unspecified: Secondary | ICD-10-CM

## 2013-05-29 LAB — URINALYSIS W MICROSCOPIC + REFLEX CULTURE
BACTERIA UA: NONE SEEN
Bilirubin Urine: NEGATIVE
CASTS: NONE SEEN
Glucose, UA: NEGATIVE mg/dL
Hgb urine dipstick: NEGATIVE
Ketones, ur: NEGATIVE mg/dL
LEUKOCYTES UA: NEGATIVE
NITRITE: NEGATIVE
PH: 6 (ref 5.0–8.0)
Protein, ur: NEGATIVE mg/dL
SPECIFIC GRAVITY, URINE: 1.028 (ref 1.005–1.030)
UROBILINOGEN UA: 1 mg/dL (ref 0.0–1.0)

## 2013-05-29 LAB — TSH: TSH: 1.064 u[IU]/mL (ref 0.350–4.500)

## 2013-06-04 ENCOUNTER — Telehealth: Payer: Self-pay | Admitting: Gynecology

## 2013-06-04 ENCOUNTER — Other Ambulatory Visit: Payer: Self-pay | Admitting: Gynecology

## 2013-06-04 ENCOUNTER — Telehealth: Payer: Self-pay | Admitting: *Deleted

## 2013-06-04 DIAGNOSIS — N971 Female infertility of tubal origin: Secondary | ICD-10-CM

## 2013-06-04 DIAGNOSIS — Z3049 Encounter for surveillance of other contraceptives: Secondary | ICD-10-CM

## 2013-06-04 MED ORDER — LEVONORGESTREL 20 MCG/24HR IU IUD: INTRAUTERINE_SYSTEM | Freq: Once | INTRAUTERINE | Status: DC

## 2013-06-04 NOTE — Telephone Encounter (Signed)
06/04/13-LM VM for pt to inform her that her [employer] ins covers the Mirena & insertion at 100%. Pt to call first day of cycle to schedule/WL

## 2013-06-04 NOTE — Telephone Encounter (Signed)
Pt called requesting HSG scheduled, I called pt back and left on voicemail I need first day of LMP.

## 2013-06-05 ENCOUNTER — Other Ambulatory Visit: Payer: Self-pay | Admitting: Gynecology

## 2013-06-05 DIAGNOSIS — IMO0002 Reserved for concepts with insufficient information to code with codable children: Secondary | ICD-10-CM

## 2013-06-05 NOTE — Telephone Encounter (Signed)
Appointment on 06/08/13 @ 2:00 pm at [facility] hospital, left message for pt to call.

## 2013-06-05 NOTE — Telephone Encounter (Signed)
Pt informed with the below note, informed no sex and how to take vibramycin

## 2013-06-08 ENCOUNTER — Ambulatory Visit (HOSPITAL_COMMUNITY)
Admission: RE | Admit: 2013-06-08 | Discharge: 2013-06-08 | Disposition: A | Discharge status: Home / Self Care | Payer: No Typology Code available for payment source | Source: Ambulatory Visit | Attending: Gynecology | Admitting: Gynecology

## 2013-06-08 DIAGNOSIS — Z9851 Tubal ligation status: Secondary | ICD-10-CM | POA: Insufficient documentation

## 2013-06-08 DIAGNOSIS — IMO0002 Reserved for concepts with insufficient information to code with codable children: Secondary | ICD-10-CM

## 2013-06-08 MED ORDER — IOHEXOL 300 MG/ML  SOLN
20.0000 mL | Freq: Once | INTRAMUSCULAR | Status: AC | PRN
  Administered 2013-06-08: 20 mL

## 2013-06-12 ENCOUNTER — Telehealth: Payer: Self-pay

## 2013-06-12 NOTE — Telephone Encounter (Signed)
She will need to come into the office for consultation and to start her on a medication

## 2013-06-12 NOTE — Telephone Encounter (Signed)
Patient said you were going to prescribe something to help her lose weight.  At visit you wrote "We will hold off on starting her on phentermine until we know that she is sterile or is using some form of contraception like Mirena IUD. "  Her HSG did show that she is sterilized.

## 2013-06-12 NOTE — Telephone Encounter (Signed)
Patient informed. Transferred to appts to schedule.

## 2013-06-28 ENCOUNTER — Ambulatory Visit (INDEPENDENT_AMBULATORY_CARE_PROVIDER_SITE_OTHER): Payer: No Typology Code available for payment source | Admitting: Gynecology

## 2013-06-28 ENCOUNTER — Encounter: Payer: Self-pay | Admitting: Gynecology

## 2013-06-28 VITALS — BP 122/78

## 2013-06-28 DIAGNOSIS — N760 Acute vaginitis: Secondary | ICD-10-CM

## 2013-06-28 DIAGNOSIS — N920 Excessive and frequent menstruation with regular cycle: Secondary | ICD-10-CM

## 2013-06-28 DIAGNOSIS — R635 Abnormal weight gain: Secondary | ICD-10-CM

## 2013-06-28 DIAGNOSIS — B9689 Other specified bacterial agents as the cause of diseases classified elsewhere: Secondary | ICD-10-CM

## 2013-06-28 DIAGNOSIS — Z113 Encounter for screening for infections with a predominantly sexual mode of transmission: Secondary | ICD-10-CM

## 2013-06-28 DIAGNOSIS — D649 Anemia, unspecified: Secondary | ICD-10-CM

## 2013-06-28 DIAGNOSIS — A499 Bacterial infection, unspecified: Secondary | ICD-10-CM

## 2013-06-28 DIAGNOSIS — N898 Other specified noninflammatory disorders of vagina: Secondary | ICD-10-CM

## 2013-06-28 LAB — WET PREP FOR TRICH, YEAST, CLUE
TRICH WET PREP: NONE SEEN
Yeast Wet Prep HPF POC: NONE SEEN

## 2013-06-28 MED ORDER — TINIDAZOLE 500 MG PO TABS: ORAL_TABLET | ORAL | Status: DC

## 2013-06-28 MED ORDER — PHENTERMINE HCL 37.5 MG PO CAPS: 37.5000 mg | ORAL_CAPSULE | ORAL | Status: DC

## 2013-06-28 NOTE — Patient Instructions (Addendum)
Phentermine tablets or capsules What is this medicine? PHENTERMINE (FEN ter meen) decreases your appetite. It is used with a reduced calorie diet and exercise to help you lose weight. This medicine may be used for other purposes; ask your health care provider or pharmacist if you have questions. COMMON BRAND NAME(S): Adipex-P, Atti-Plex P , Atti-Plex P Spansule , Fastin, Pro-Fast, Tara-8  What should I tell my health care provider before I take this medicine? They need to know if you have any of these conditions: -agitation -glaucoma -heart disease -high blood pressure -history of substance abuse -lung disease called Primary Pulmonary Hypertension (PPH) -taken an MAOI like Carbex, Eldepryl, Marplan, Nardil, or Parnate in last 14 days -thyroid disease -an unusual or allergic reaction to phentermine, other medicines, foods, dyes, or preservatives -pregnant or trying to get pregnant -breast-feeding How should I use this medicine? Take this medicine by mouth with a glass of water. Follow the directions on the prescription label. This medicine is usually taken 30 minutes before or 1 to 2 hours after breakfast. Avoid taking this medicine in the evening. It may interfere with sleep. Take your doses at regular intervals. Do not take your medicine more often than directed. Talk to your pediatrician regarding the use of this medicine in children. Special care may be needed. Overdosage: If you think you have taken too much of this medicine contact a poison control center or emergency room at once. NOTE: This medicine is only for you. Do not share this medicine with others. What if I miss a dose? If you miss a dose, take it as soon as you can. If it is almost time for your next dose, take only that dose. Do not take double or extra doses. What may interact with this medicine? Do not take this medicine with any of the following medications: -duloxetine -MAOIs  like Carbex, Eldepryl, Marplan, Nardil, and Parnate -medicines for colds or breathing difficulties like pseudoephedrine or phenylephrine -procarbazine -sibutramine -SSRIs like citalopram, escitalopram, fluoxetine, fluvoxamine, paroxetine, and sertraline -stimulants like dexmethylphenidate, methylphenidate or modafinil -venlafaxine This medicine may also interact with the following medications: -medicines for diabetes This list may not describe all possible interactions. Give your health care provider a list of all the medicines, herbs, non-prescription drugs, or dietary supplements you use. Also tell them if you smoke, drink alcohol, or use illegal drugs. Some items may interact with your medicine. What should I watch for while using this medicine? Notify your physician immediately if you become short of breath while doing your normal activities. Do not take this medicine within 6 hours of bedtime. It can keep you from getting to sleep. Avoid drinks that contain caffeine and try to stick to a regular bedtime every night. This medicine was intended to be used in addition to a healthy diet and exercise. The best results are achieved this way. This medicine is only indicated for short-term use. Eventually your weight loss may level out. At that point, the drug will only help you maintain your new weight. Do not increase or in any way change your dose without consulting your doctor. You may get drowsy or dizzy. Do not drive, use machinery, or do anything that needs mental alertness until you know how this medicine affects you. Do not stand or sit up quickly, especially if you are an older patient. This reduces the risk of dizzy or fainting spells. Alcohol may increase dizziness and drowsiness. Avoid alcoholic drinks. What side effects may I notice from receiving this medicine?  Side effects that you should report to your doctor or health care professional as soon as possible: -chest pain,  palpitations -depression or severe changes in mood -increased blood pressure -irritability -nervousness or restlessness -severe dizziness -shortness of breath -problems urinating -unusual swelling of the legs -vomiting Side effects that usually do not require medical attention (report to your doctor or health care professional if they continue or are bothersome): -blurred vision or other eye problems -changes in sexual ability or desire -constipation or diarrhea -difficulty sleeping -dry mouth or unpleasant taste -headache -nausea This list may not describe all possible side effects. Call your doctor for medical advice about side effects. You may report side effects to FDA at 1-800-FDA-1088. Where should I keep my medicine? Keep out of the reach of children. This medicine can be abused. Keep your medicine in a safe place to protect it from theft. Do not share this medicine with anyone. Selling or giving away this medicine is dangerous and against the law. Store at room temperature between 20 and 25 degrees C (68 and 77 degrees F). Keep container tightly closed. Throw away any unused medicine after the expiration date. NOTE: This sheet is a summary. It may not cover all possible information. If you have questions about this medicine, talk to your doctor, pharmacist, or health care provider.  2014, Elsevier/Gold Standard. (2010-06-10 11:02:44)   Bacterial Vaginosis Bacterial vaginosis is a vaginal infection that occurs when the normal balance of bacteria in the vagina is disrupted. It results from an overgrowth of certain bacteria. This is the most common vaginal infection in women of childbearing age. Treatment is important to prevent complications, especially in pregnant women, as it can cause a premature delivery. CAUSES  Bacterial vaginosis is caused by an increase in harmful bacteria that are normally present in smaller amounts in the vagina. Several different kinds of bacteria can  cause bacterial vaginosis. However, the reason that the condition develops is not fully understood. RISK FACTORS Certain activities or behaviors can put you at an increased risk of developing bacterial vaginosis, including:  Having a new sex partner or multiple sex partners.  Douching.  Using an intrauterine device (IUD) for contraception. Women do not get bacterial vaginosis from toilet seats, bedding, swimming pools, or contact with objects around them. SIGNS AND SYMPTOMS  Some women with bacterial vaginosis have no signs or symptoms. Common symptoms include:  Grey vaginal discharge.  A fishlike odor with discharge, especially after sexual intercourse.  Itching or burning of the vagina and vulva.  Burning or pain with urination. DIAGNOSIS  Your health care provider will take a medical history and examine the vagina for signs of bacterial vaginosis. A sample of vaginal fluid may be taken. Your health care provider will look at this sample under a microscope to check for bacteria and abnormal cells. A vaginal pH test may also be done.  TREATMENT  Bacterial vaginosis may be treated with antibiotic medicines. These may be given in the form of a pill or a vaginal cream. A second round of antibiotics may be prescribed if the condition comes back after treatment.  HOME CARE INSTRUCTIONS   Only take over-the-counter or prescription medicines as directed by your health care provider.  If antibiotic medicine was prescribed, take it as directed. Make sure you finish it even if you start to feel better.  Do not have sex until treatment is completed.  Tell all sexual partners that you have a vaginal infection. They should see their health care  provider and be treated if they have problems, such as a mild rash or itching.  Practice safe sex by using condoms and only having one sex partner. SEEK MEDICAL CARE IF:   Your symptoms are not improving after 3 days of treatment.  You have  increased discharge or pain.  You have a fever. MAKE SURE YOU:   Understand these instructions.  Will watch your condition.  Will get help right away if you are not doing well or get worse. FOR MORE INFORMATION  Centers for Disease Control and Prevention, Division of STD Prevention: AppraiserFraud.fi American Sexual Health Association (ASHA): www.ashastd.org  Document Released: 04/26/2005 Document Revised: 02/14/2013 Document Reviewed: 12/06/2012 [facility] Patient Information 2014 [place].

## 2013-06-28 NOTE — Progress Notes (Signed)
   The patient presented to the office today complaining of a vaginal discharge past few days and also to discuss further her issues with weight gain as we had discussed on her previous annual exam visit on 05/28/2013. Patient's had previous tubal ligation. Patient is having heavy cycles and is waiting to return with her next menses to place a Mirena IUD. Her labs in January 2015 included a comprehensive metabolic panel which was normal. Her CBC demonstrating hemoglobin 10.7 for which she's currently taking iron supplementation. Her TSH and blood sugar were normal as was her urinalysis and Pap smear.  Patient is in a same-sex relationship and describes the discharge as clear with fishy like odor. Patient's currently weighing 226 pounds at 5 feet 6 inches tall with a BMI of 36.49.  We had previously discussed phentermine as an appetite suppressant along with dietary counseling and exercise.  Exam: Bartholin's urethra Skene glands within normal limits Vagina: Clear with fishy clear like the discharge was noted Cervix: No gross lesions on inspection Bimanual exam: Not done Rectal exam: Not done  GC culture obtained results pending at time of this dictation  Wet prep: Pos Amine, moderate clue cell, too numerous to count bacteria and rare WBCs  Assessment/plan: #1 bacterial vaginosis will be treated with Tindamax 500 mg 4 tablets today and to repeat in 24 hours #2 GC and Chlamydia culture results pending at time of this dictation #3 obesity will refer to [facility] for dietary consultation. We will start her on phentermine 37.5 mg 1 by mouth daily. Patient returned to the office monthly for cardiac and pulmonary auscultation as well as blood pressure and weight readings. The risks benefits and pros and cons of the medication were discussed in detail such as a risk for pulmonary hypertension or cardiac valvular disease and for this reason we will only keep her on medication not to exceed 4  months. We also encouraged regular exercise one hour 3-4 times a week. #4 menorrhagia with anemia. Patient was started on daily iron. Will return with start of next cylce to place Mirena UID

## 2013-06-29 ENCOUNTER — Telehealth: Payer: Self-pay | Admitting: *Deleted

## 2013-06-29 DIAGNOSIS — R634 Abnormal weight loss: Secondary | ICD-10-CM

## 2013-06-29 LAB — GC/CHLAMYDIA PROBE AMP
CT Probe RNA: NEGATIVE
GC PROBE AMP APTIMA: NEGATIVE

## 2013-06-29 NOTE — Telephone Encounter (Signed)
Message copied by Thamas Jaegers on Fri Jun 29, 2013  9:29 AM ------      Message from: Terrance Mass      Created: Thu Jun 28, 2013  2:50 PM       Anderson Malta this patient will need an appointment with the [facility] for dietary weight loosing diet. ------

## 2013-06-29 NOTE — Telephone Encounter (Signed)
Pt rx for phentermine 37.5 mg  was set on print, I called Rx in to [place] for patient.

## 2013-06-29 NOTE — Telephone Encounter (Signed)
Order placed they will contact patient to schedule. 

## 2013-07-03 NOTE — Telephone Encounter (Signed)
appt 07/05/13 @ 2:15 am

## 2013-07-05 ENCOUNTER — Ambulatory Visit: Payer: No Typology Code available for payment source | Admitting: Gynecology

## 2013-07-05 ENCOUNTER — Ambulatory Visit: Payer: BC Managed Care – PPO | Admitting: Dietician

## 2013-07-26 ENCOUNTER — Ambulatory Visit (INDEPENDENT_AMBULATORY_CARE_PROVIDER_SITE_OTHER): Payer: No Typology Code available for payment source | Admitting: Gynecology

## 2013-07-26 ENCOUNTER — Encounter: Payer: Self-pay | Admitting: Gynecology

## 2013-07-26 VITALS — BP 118/80 | Wt 219.0 lb

## 2013-07-26 DIAGNOSIS — R635 Abnormal weight gain: Secondary | ICD-10-CM

## 2013-07-26 MED ORDER — PHENTERMINE HCL 37.5 MG PO CAPS: 37.5000 mg | ORAL_CAPSULE | ORAL | Status: DC

## 2013-07-26 NOTE — Progress Notes (Signed)
   patient presented to the office today for one month followup after placing her on phentermine 37.5 mg as an appetite suppressant for a 3 month time frame along with encouraging exercise 3-4 times a week for at least one hour along with dietary counseling. She reports no side effects from the medication she had previously been counseled as to potential side effects such as pulmonary hypertension or cardiovascular disease. Patient is in a same-sex relationship.  Last office visit patient was weighing 226 pounds at 5 feet 6 inches tall with a BMI of 36.49  Today's visit she was weighing 219 pounds with a BMI of 35.35  Exam: Lungs clear to auscultation no rhonchi or wheezes Heart: Regular rate and rhythm no murmurs or gallops HEENT unremarkable   Assessment/plan: Patient overweight started phentermine one month ago has lost 9 pounds. Encouraged her to increase her weekly exercise activity as well as to adhere to her diet plan. She will continue on this medication for 2 more months at which time she'll return back to the office for followup and we will discontinue the medication and she will continue with her diet and exercise. She is fully aware of the risk of this medication and accepts.

## 2013-07-29 ENCOUNTER — Other Ambulatory Visit: Payer: Self-pay | Admitting: Gynecology

## 2013-07-30 ENCOUNTER — Telehealth: Payer: Self-pay | Admitting: *Deleted

## 2013-07-30 NOTE — Telephone Encounter (Signed)
Pt called because rx for phentermine 37.5 mg on OV 07/26/13 was never went, rx must be called in. I called patient and informed her with this, rx was called in.

## 2013-08-01 ENCOUNTER — Telehealth: Payer: Self-pay | Admitting: *Deleted

## 2013-08-01 MED ORDER — NORETHIN ACE-ETH ESTRAD-FE 1-20 MG-MCG(24) PO TABS: 1.0000 | ORAL_TABLET | Freq: Every day | ORAL | Status: DC

## 2013-08-01 NOTE — Telephone Encounter (Signed)
Pt informed, rx sent 

## 2013-08-01 NOTE — Telephone Encounter (Signed)
Please call in prescription for Loestrin 28 OCP. Have her start on second day of cycle. 3 month supply with 4 refills. I would recommend she consider the Mirena IUD. There is risk of weigh gain even on this low dose OCP but we can give  A try. She cant smoke and be on OCP.

## 2013-08-01 NOTE — Telephone Encounter (Signed)
Pt called requesting to start on a form of birth control pills to help with heavy cycles. Pt was seen for annual on 05/28/13 had discuss a Mirena IUD,s he declined Mirena as of now. Pt would like recommendations. Please advise

## 2013-08-07 ENCOUNTER — Telehealth: Payer: Self-pay | Admitting: *Deleted

## 2013-08-07 NOTE — Telephone Encounter (Signed)
Pt was prescribed birth controls pills on 08/01/13 c/o still bleeding from bleeding from being on cycle already while starting pills. I left message on her voicemail this is normal to having irregular bleeding.

## 2013-09-04 ENCOUNTER — Telehealth: Payer: Self-pay | Admitting: *Deleted

## 2013-09-04 NOTE — Telephone Encounter (Signed)
Pt called requesting medication for BV infection, I called pt and left on voicemail OV best for infection.

## 2013-09-07 ENCOUNTER — Ambulatory Visit: Payer: No Typology Code available for payment source | Admitting: Women's Health

## 2013-09-14 ENCOUNTER — Ambulatory Visit (INDEPENDENT_AMBULATORY_CARE_PROVIDER_SITE_OTHER): Payer: No Typology Code available for payment source | Admitting: Women's Health

## 2013-09-14 DIAGNOSIS — N760 Acute vaginitis: Secondary | ICD-10-CM

## 2013-09-14 DIAGNOSIS — N898 Other specified noninflammatory disorders of vagina: Secondary | ICD-10-CM

## 2013-09-14 DIAGNOSIS — B373 Candidiasis of vulva and vagina: Secondary | ICD-10-CM

## 2013-09-14 DIAGNOSIS — R350 Frequency of micturition: Secondary | ICD-10-CM

## 2013-09-14 DIAGNOSIS — B3731 Acute candidiasis of vulva and vagina: Secondary | ICD-10-CM

## 2013-09-14 DIAGNOSIS — B9689 Other specified bacterial agents as the cause of diseases classified elsewhere: Secondary | ICD-10-CM

## 2013-09-14 DIAGNOSIS — A499 Bacterial infection, unspecified: Secondary | ICD-10-CM

## 2013-09-14 LAB — URINALYSIS W MICROSCOPIC + REFLEX CULTURE
Bilirubin Urine: NEGATIVE
CASTS: NONE SEEN
Crystals: NONE SEEN
GLUCOSE, UA: NEGATIVE mg/dL
KETONES UR: NEGATIVE mg/dL
NITRITE: NEGATIVE
PH: 5.5 (ref 5.0–8.0)
PROTEIN: NEGATIVE mg/dL
Specific Gravity, Urine: 1.025 (ref 1.005–1.030)
Urobilinogen, UA: 1 mg/dL (ref 0.0–1.0)

## 2013-09-14 LAB — WET PREP FOR TRICH, YEAST, CLUE: TRICH WET PREP: NONE SEEN

## 2013-09-14 MED ORDER — FLUCONAZOLE 150 MG PO TABS: 150.0000 mg | ORAL_TABLET | Freq: Once | ORAL | Status: DC

## 2013-09-14 MED ORDER — LACTULOSE 10 GM/15ML PO SOLN: 10.0000 g | Freq: Every day | ORAL | Status: DC | PRN

## 2013-09-14 MED ORDER — METRONIDAZOLE 0.75 % VA GEL
VAGINAL | Status: DC
Start: 2013-09-14 — End: 2013-11-17

## 2013-09-14 NOTE — Progress Notes (Signed)
Patient ID: Charlene Mccullough, female   DOB: [birth year], 38 y.o.   MRN: [id number] Presents with complaint vaginal discharge, heavy cycles, and urinary frequency. Reports cycles are monthly last 8 days most days heavy. BTL on Loestrin for menorrhagia with minimal relief. Requests treatment for heavy periods, refill on lactulose for constipation. Evaluated from a GI standpoint has chronic constipation. Denies pain or burning with urination, frequency of urination mostly at home, states only empties bladder 1-2 times during the work today.   Exam: Appears well. Abdomen soft, CVAT. External genitalia within normal limits, speculum exam moderate white discharge, wet prep positive for moderate use, clues, and TNTC bacteria. Bimanual no CMT or adnexal fullness or tenderness. UA 3- 6 WBCs, trace leukocytes, many bacteria  Yeast vaginitis Bacteria vaginosis Urinary frequency Menorrhagia  Plan: Recent hysterosalpingogram noted fundal fibroid, no endometrial defects. Her option ablation reviewed, handout given, risks reviewed, instructed to schedule with Dr. Toney Rakes if would like to proceed. MetroGel vaginal cream 1 applicator at bedtime x5, alcohol precautions reviewed, Diflucan 150 by mouth x1 dose, prescription proper use given and reviewed. Instructed to call if no relief of discharge. , urine culture pending.

## 2013-09-14 NOTE — Patient Instructions (Signed)
Monilial Vaginitis  Vaginitis in a soreness, swelling and redness (inflammation) of the vagina and vulva. Monilial vaginitis is not a sexually transmitted infection.  CAUSES   Yeast vaginitis is caused by yeast (candida) that is normally found in your vagina. With a yeast infection, the candida has overgrown in number to a point that upsets the chemical balance.  SYMPTOMS   · White, thick vaginal discharge.  · Swelling, itching, redness and irritation of the vagina and possibly the lips of the vagina (vulva).  · Burning or painful urination.  · Painful intercourse.  DIAGNOSIS   Things that may contribute to monilial vaginitis are:  · Postmenopausal and virginal states.  · Pregnancy.  · Infections.  · Being tired, sick or stressed, especially if you had monilial vaginitis in the past.  · Diabetes. Good control will help lower the chance.  · Birth control pills.  · Tight fitting garments.  · Using bubble bath, feminine sprays, douches or deodorant tampons.  · Taking certain medications that kill germs (antibiotics).  · Sporadic recurrence can occur if you become ill.  TREATMENT   Your caregiver will give you medication.  · There are several kinds of anti monilial vaginal creams and suppositories specific for monilial vaginitis. For recurrent yeast infections, use a suppository or cream in the vagina 2 times a week, or as directed.  · Anti-monilial or steroid cream for the itching or irritation of the vulva may also be used. Get your caregiver's permission.  · Painting the vagina with methylene blue solution may help if the monilial cream does not work.  · Eating yogurt may help prevent monilial vaginitis.  HOME CARE INSTRUCTIONS   · Finish all medication as prescribed.  · Do not have sex until treatment is completed or after your caregiver tells you it is okay.  · Take warm sitz baths.  · Do not douche.  · Do not use tampons, especially scented ones.  · Wear cotton underwear.  · Avoid tight pants and panty  hose.  · Tell your sexual partner that you have a yeast infection. They should go to their caregiver if they have symptoms such as mild rash or itching.  · Your sexual partner should be treated as well if your infection is difficult to eliminate.  · Practice safer sex. Use condoms.  · Some vaginal medications cause latex condoms to fail. Vaginal medications that harm condoms are:  · Cleocin cream.  · Butoconazole (Femstat®).  · Terconazole (Terazol®) vaginal suppository.  · Miconazole (Monistat®) (may be purchased over the counter).  SEEK MEDICAL CARE IF:   · You have a temperature by mouth above 102° F (38.9° C).  · The infection is getting worse after 2 days of treatment.  · The infection is not getting better after 3 days of treatment.  · You develop blisters in or around your vagina.  · You develop vaginal bleeding, and it is not your menstrual period.  · You have pain when you urinate.  · You develop intestinal problems.  · You have pain with sexual intercourse.  Document Released: 02/03/2005 Document Revised: 07/19/2011 Document Reviewed: 10/18/2008  [employer]® Patient Information ©2014 [employer].

## 2013-09-15 LAB — URINE CULTURE
Colony Count: NO GROWTH
Organism ID, Bacteria: NO GROWTH

## 2013-09-17 ENCOUNTER — Telehealth: Payer: Self-pay

## 2013-09-17 NOTE — Telephone Encounter (Signed)
I called patient to let her know I spoke with her ins co to check benefits for Her Option Ablation to be done in the office. I was told that she has a fixed, limited indemnity plan and that it does not cover office procedures, injections or  Office surgery.  I left patient a detailed message in her voice mail and told her she can call me if she wants but if she has further questions best to speak with her ins co.

## 2013-09-18 ENCOUNTER — Telehealth: Payer: Self-pay

## 2013-09-18 NOTE — Telephone Encounter (Signed)
Patient asked me since her ins plan would not cover surgery in the office would I check and see if they would cover it outpatient at the hospital.  Again, she has a fixed, limited indemnity plans.  It will pay $600 per calendar year toward one outpatient procedure a year.  She has already used the benefit for this plan year.  She was informed that it is more costly to go to hospital that having ablation in the office.

## 2013-09-27 ENCOUNTER — Ambulatory Visit: Payer: No Typology Code available for payment source | Admitting: Gynecology

## 2013-10-09 ENCOUNTER — Encounter: Payer: Self-pay | Admitting: Gynecology

## 2013-10-09 ENCOUNTER — Ambulatory Visit (INDEPENDENT_AMBULATORY_CARE_PROVIDER_SITE_OTHER): Payer: No Typology Code available for payment source | Admitting: Gynecology

## 2013-10-09 ENCOUNTER — Other Ambulatory Visit: Payer: Self-pay | Admitting: Gynecology

## 2013-10-09 VITALS — BP 120/80

## 2013-10-09 DIAGNOSIS — Z3046 Encounter for surveillance of implantable subdermal contraceptive: Secondary | ICD-10-CM

## 2013-10-09 DIAGNOSIS — Z3049 Encounter for surveillance of other contraceptives: Secondary | ICD-10-CM

## 2013-10-09 DIAGNOSIS — N949 Unspecified condition associated with female genital organs and menstrual cycle: Secondary | ICD-10-CM

## 2013-10-09 DIAGNOSIS — R635 Abnormal weight gain: Secondary | ICD-10-CM

## 2013-10-09 DIAGNOSIS — N938 Other specified abnormal uterine and vaginal bleeding: Secondary | ICD-10-CM

## 2013-10-09 LAB — WET PREP FOR TRICH, YEAST, CLUE
CLUE CELLS WET PREP: NONE SEEN
TRICH WET PREP: NONE SEEN
WBC, Wet Prep HPF POC: NONE SEEN
Yeast Wet Prep HPF POC: NONE SEEN

## 2013-10-09 MED ORDER — LEVONORGESTREL 20 MCG/24HR IU IUD: INTRAUTERINE_SYSTEM | Freq: Once | INTRAUTERINE | Status: DC

## 2013-10-09 MED ORDER — PHENTERMINE HCL 37.5 MG PO CAPS: 37.5000 mg | ORAL_CAPSULE | ORAL | Status: DC

## 2013-10-09 NOTE — Patient Instructions (Signed)
Levonorgestrel intrauterine device (IUD) What is this medicine? LEVONORGESTREL IUD (LEE voe nor jes trel) is a contraceptive (birth control) device. The device is placed inside the uterus by a healthcare professional. It is used to prevent pregnancy and can also be used to treat heavy bleeding that occurs during your period. Depending on the device, it can be used for 3 to 5 years. This medicine may be used for other purposes; ask your health care provider or pharmacist if you have questions. COMMON BRAND NAME(S): Mirena, Skyla What should I tell my health care provider before I take this medicine? They need to know if you have any of these conditions: -abnormal Pap smear -cancer of the breast, uterus, or cervix -diabetes -endometritis -genital or pelvic infection now or in the past -have more than one sexual partner or your partner has more than one partner -heart disease -history of an ectopic or tubal pregnancy -immune system problems -IUD in place -liver disease or tumor -problems with blood clots or take blood-thinners -use intravenous drugs -uterus of unusual shape -vaginal bleeding that has not been explained -an unusual or allergic reaction to levonorgestrel, other hormones, silicone, or polyethylene, medicines, foods, dyes, or preservatives -pregnant or trying to get pregnant -breast-feeding How should I use this medicine? This device is placed inside the uterus by a health care professional. Talk to your pediatrician regarding the use of this medicine in children. Special care may be needed. Overdosage: If you think you have taken too much of this medicine contact a poison control center or emergency room at once. NOTE: This medicine is only for you. Do not share this medicine with others. What if I miss a dose? This does not apply. What may interact with this medicine? Do not take this medicine with any of the following  medications: -amprenavir -bosentan -fosamprenavir This medicine may also interact with the following medications: -aprepitant -barbiturate medicines for inducing sleep or treating seizures -bexarotene -griseofulvin -medicines to treat seizures like carbamazepine, ethotoin, felbamate, oxcarbazepine, phenytoin, topiramate -modafinil -pioglitazone -rifabutin -rifampin -rifapentine -some medicines to treat HIV infection like atazanavir, indinavir, lopinavir, nelfinavir, tipranavir, ritonavir -St. John's wort -warfarin This list may not describe all possible interactions. Give your health care provider a list of all the medicines, herbs, non-prescription drugs, or dietary supplements you use. Also tell them if you smoke, drink alcohol, or use illegal drugs. Some items may interact with your medicine. What should I watch for while using this medicine? Visit your doctor or health care professional for regular check ups. See your doctor if you or your partner has sexual contact with others, becomes HIV positive, or gets a sexual transmitted disease. This product does not protect you against HIV infection (AIDS) or other sexually transmitted diseases. You can check the placement of the IUD yourself by reaching up to the top of your vagina with clean fingers to feel the threads. Do not pull on the threads. It is a good habit to check placement after each menstrual period. Call your doctor right away if you feel more of the IUD than just the threads or if you cannot feel the threads at all. The IUD may come out by itself. You may become pregnant if the device comes out. If you notice that the IUD has come out use a backup birth control method like condoms and call your health care provider. Using tampons will not change the position of the IUD and are okay to use during your period. What side effects may I   notice from receiving this medicine? Side effects that you should report to your doctor or  health care professional as soon as possible: -allergic reactions like skin rash, itching or hives, swelling of the face, lips, or tongue -fever, flu-like symptoms -genital sores -high blood pressure -no menstrual period for 6 weeks during use -pain, swelling, warmth in the leg -pelvic pain or tenderness -severe or sudden headache -signs of pregnancy -stomach cramping -sudden shortness of breath -trouble with balance, talking, or walking -unusual vaginal bleeding, discharge -yellowing of the eyes or skin Side effects that usually do not require medical attention (report to your doctor or health care professional if they continue or are bothersome): -acne -breast pain -change in sex drive or performance -changes in weight -cramping, dizziness, or faintness while the device is being inserted -headache -irregular menstrual bleeding within first 3 to 6 months of use -nausea This list may not describe all possible side effects. Call your doctor for medical advice about side effects. You may report side effects to FDA at 1-800-FDA-1088. Where should I keep my medicine? This does not apply. NOTE: This sheet is a summary. It may not cover all possible information. If you have questions about this medicine, talk to your doctor, pharmacist, or health care provider.  2014, Elsevier/Gold Standard. (2011-05-27 13:54:04)  

## 2013-10-09 NOTE — Progress Notes (Signed)
   Patient presented to the office after her three-month start on phentermine 37.5 mg as a result of her weight. She had also been started on a weight reduction plan as well as exercise planned. When she started medication February 2015 she was weighing 226 pounds she is down now to 217 pounds. Patient also has been on oral contraceptive pills for menorrhagia and has had breakthrough bleeding. She is in a same sex relationship. She states that she has been compliant with medication. Patient would like to continue phentermine for 3 more months. She was counseled as to potential risks include pulmonary hypertension and cardiac valvular disease and she fully understands and accepts.  Vital signs: Blood pressure 120/80  Exam: HEENT unremarkable Lungs clear to auscultation rhonchi or wheezes Heart regular rate and rhythm no murmurs or gallops Abdomen: Soft nontender no rebound guarding Pelvic: And urethra Skene glands within normal limits Vagina: Menstrual blood present Uterus: Anteverted slightly irregular at the fundus no palpable adnexal masses Rectal exam: Not done  Wet prep negative  Assessment/plan: Patient would like to come off the oral contraceptive pill she is interested in the [place] IUD for which the literature information was provided. She will return back next week when she finishes her current oral contraceptive pill pack. She was prescribed phentermine 37.5 mg 1 by mouth daily for 3 more months with no refills. Once again the patient was counseled.

## 2013-10-19 ENCOUNTER — Encounter: Payer: Self-pay | Admitting: Gynecology

## 2013-10-19 ENCOUNTER — Ambulatory Visit (INDEPENDENT_AMBULATORY_CARE_PROVIDER_SITE_OTHER): Payer: No Typology Code available for payment source | Admitting: Gynecology

## 2013-10-19 VITALS — BP 114/78

## 2013-10-19 DIAGNOSIS — R635 Abnormal weight gain: Secondary | ICD-10-CM

## 2013-10-19 DIAGNOSIS — Z975 Presence of (intrauterine) contraceptive device: Secondary | ICD-10-CM | POA: Insufficient documentation

## 2013-10-19 DIAGNOSIS — Z30430 Encounter for insertion of intrauterine contraceptive device: Secondary | ICD-10-CM

## 2013-10-19 NOTE — Patient Instructions (Signed)
Levonorgestrel intrauterine device (IUD) What is this medicine? LEVONORGESTREL IUD (LEE voe nor jes trel) is a contraceptive (birth control) device. The device is placed inside the uterus by a healthcare professional. It is used to prevent pregnancy and can also be used to treat heavy bleeding that occurs during your period. Depending on the device, it can be used for 3 to 5 years. This medicine may be used for other purposes; ask your health care provider or pharmacist if you have questions. COMMON BRAND NAME(S): Mirena, Skyla What should I tell my health care provider before I take this medicine? They need to know if you have any of these conditions: -abnormal Pap smear -cancer of the breast, uterus, or cervix -diabetes -endometritis -genital or pelvic infection now or in the past -have more than one sexual partner or your partner has more than one partner -heart disease -history of an ectopic or tubal pregnancy -immune system problems -IUD in place -liver disease or tumor -problems with blood clots or take blood-thinners -use intravenous drugs -uterus of unusual shape -vaginal bleeding that has not been explained -an unusual or allergic reaction to levonorgestrel, other hormones, silicone, or polyethylene, medicines, foods, dyes, or preservatives -pregnant or trying to get pregnant -breast-feeding How should I use this medicine? This device is placed inside the uterus by a health care professional. Talk to your pediatrician regarding the use of this medicine in children. Special care may be needed. Overdosage: If you think you have taken too much of this medicine contact a poison control center or emergency room at once. NOTE: This medicine is only for you. Do not share this medicine with others. What if I miss a dose? This does not apply. What may interact with this medicine? Do not take this medicine with any of the following  medications: -amprenavir -bosentan -fosamprenavir This medicine may also interact with the following medications: -aprepitant -barbiturate medicines for inducing sleep or treating seizures -bexarotene -griseofulvin -medicines to treat seizures like carbamazepine, ethotoin, felbamate, oxcarbazepine, phenytoin, topiramate -modafinil -pioglitazone -rifabutin -rifampin -rifapentine -some medicines to treat HIV infection like atazanavir, indinavir, lopinavir, nelfinavir, tipranavir, ritonavir -St. John's wort -warfarin This list may not describe all possible interactions. Give your health care provider a list of all the medicines, herbs, non-prescription drugs, or dietary supplements you use. Also tell them if you smoke, drink alcohol, or use illegal drugs. Some items may interact with your medicine. What should I watch for while using this medicine? Visit your doctor or health care professional for regular check ups. See your doctor if you or your partner has sexual contact with others, becomes HIV positive, or gets a sexual transmitted disease. This product does not protect you against HIV infection (AIDS) or other sexually transmitted diseases. You can check the placement of the IUD yourself by reaching up to the top of your vagina with clean fingers to feel the threads. Do not pull on the threads. It is a good habit to check placement after each menstrual period. Call your doctor right away if you feel more of the IUD than just the threads or if you cannot feel the threads at all. The IUD may come out by itself. You may become pregnant if the device comes out. If you notice that the IUD has come out use a backup birth control method like condoms and call your health care provider. Using tampons will not change the position of the IUD and are okay to use during your period. What side effects may I   notice from receiving this medicine? Side effects that you should report to your doctor or  health care professional as soon as possible: -allergic reactions like skin rash, itching or hives, swelling of the face, lips, or tongue -fever, flu-like symptoms -genital sores -high blood pressure -no menstrual period for 6 weeks during use -pain, swelling, warmth in the leg -pelvic pain or tenderness -severe or sudden headache -signs of pregnancy -stomach cramping -sudden shortness of breath -trouble with balance, talking, or walking -unusual vaginal bleeding, discharge -yellowing of the eyes or skin Side effects that usually do not require medical attention (report to your doctor or health care professional if they continue or are bothersome): -acne -breast pain -change in sex drive or performance -changes in weight -cramping, dizziness, or faintness while the device is being inserted -headache -irregular menstrual bleeding within first 3 to 6 months of use -nausea This list may not describe all possible side effects. Call your doctor for medical advice about side effects. You may report side effects to FDA at 1-800-FDA-1088. Where should I keep my medicine? This does not apply. NOTE: This sheet is a summary. It may not cover all possible information. If you have questions about this medicine, talk to your doctor, pharmacist, or health care provider.  2014, Elsevier/Gold Standard. (2011-05-27 13:54:04)  

## 2013-10-19 NOTE — Progress Notes (Signed)
   Patient presented to the office today to place a Mirena IUD to help with her menorrhagia. Patient previously been on oral contraceptive pill but had irregular bleeding. Patient is currently menstruating. Patient is in a same-sex relationship. She had 4 prior children delivered vaginally. Patient also was started on phentermine 37.5 mg daily in February to help reduce weight. He was weighing initially 226 pounds and weighing 219 pounds today. We extended for 3 additional month as per patient's request. She was counseled as to potential risk of pulmonary hypertension and cardiac valvular disease and she finally accepts the risk.  Exam: Blood pressure 118/82, heart rate 80, respiration 14, weight 219 pounds Lungs clear to auscultation without wheezes Heart: Regular rate and rhythm the murmurs or gallops  Patient was counseled for placement of a Mirena IUD and previously had been provided literature information.                                                                    IUD procedure note       Patient presented to the office today for placement of Mirena IUD. The patient had previously been provided with literature information on this method of contraception. The risks benefits and pros and cons were discussed and all her questions were answered. She is fully aware that this form of contraception is 99% effective and is good for 5 years.  Pelvic exam: Bartholin urethra Skene glands: Within normal limits Vagina: No lesions or discharge Cervix: No lesions or discharge Uterus: Anteverted position Adnexa: No masses or tenderness Rectal exam: Not done  The cervix was cleansed with Betadine solution. A single-tooth tenaculum was placed on the anterior cervical lip. The uterus sounded to 8 centimeter. The IUD was shown to the patient and inserted in a sterile fashion. The IUD string was trimmed. The single-tooth tenaculum was removed. Patient was instructed to return back to the office in one  month for follow up.

## 2013-10-29 ENCOUNTER — Telehealth: Payer: Self-pay | Admitting: *Deleted

## 2013-10-29 MED ORDER — ESTROGENS CONJUGATED 0.625 MG PO TABS: ORAL_TABLET | ORAL | Status: DC

## 2013-10-29 NOTE — Telephone Encounter (Signed)
rx sent, pt informed.  

## 2013-10-29 NOTE — Telephone Encounter (Signed)
Pt calling c/o vaginal bleeding, had Mirena IUD placed on 10/19/13, pt states has been bleeding since end may. Pt is very tired of bleeding asked if Rx could help with this? Bleeding varies from heavy and medium flow. Please advise

## 2013-10-29 NOTE — Telephone Encounter (Signed)
Please call her in prescription for Premarin 0.625 mg 1 by mouth daily for 30 days to help build up her endometrium. That this medication is used for postmenopausal patient but also this can be used in this scenario. This will help stop her menses.

## 2013-11-14 ENCOUNTER — Telehealth: Payer: Self-pay

## 2013-11-14 DIAGNOSIS — N949 Unspecified condition associated with female genital organs and menstrual cycle: Secondary | ICD-10-CM

## 2013-11-14 DIAGNOSIS — N938 Other specified abnormal uterine and vaginal bleeding: Secondary | ICD-10-CM

## 2013-11-14 MED ORDER — MEGESTROL ACETATE 40 MG PO TABS: 40.0000 mg | ORAL_TABLET | Freq: Two times a day (BID) | ORAL | Status: DC

## 2013-11-14 NOTE — Telephone Encounter (Signed)
Have her do a home pregnancy test. If negative: Prescription for Megace 40 mg twice a day for the next 10 days would. Her bleeding. Schedule an office visit with ultrasound to make sure that the IUD has not changed position. And also to rule out an ovarian cyst. I would also like to check her CBC to look at her platelet count as well

## 2013-11-14 NOTE — Telephone Encounter (Signed)
Should she stop the Premarin?

## 2013-11-14 NOTE — Telephone Encounter (Signed)
Yes, stop the Premarin

## 2013-11-14 NOTE — Telephone Encounter (Signed)
Patient had Mirena inserted on 10/19/13.  On 10/29/13 called because bleeding since May persisted. You started her on Premarin to help stop the bleeding.  She calls today because she is still bleeding and is tired of it. She want to get to the cause of why she is bleeding.

## 2013-11-14 NOTE — Telephone Encounter (Signed)
Patient advised. Rx sent and lab order put in system and appt scheduled.

## 2013-11-14 NOTE — Telephone Encounter (Signed)
Yes Premarin

## 2013-11-17 ENCOUNTER — Emergency Department (HOSPITAL_COMMUNITY)
Admission: EM | Admit: 2013-11-17 | Discharge: 2013-11-18 | Disposition: A | Discharge status: Home / Self Care | Payer: No Typology Code available for payment source | Attending: Emergency Medicine | Admitting: Emergency Medicine

## 2013-11-17 ENCOUNTER — Encounter (HOSPITAL_COMMUNITY): Payer: Self-pay | Admitting: Emergency Medicine

## 2013-11-17 DIAGNOSIS — N898 Other specified noninflammatory disorders of vagina: Secondary | ICD-10-CM | POA: Insufficient documentation

## 2013-11-17 NOTE — ED Notes (Signed)
Pt present with vaginal bleeding x 2 months. Pt is being tx by OB/GYN for same. Pt states same amount she is here tonight to have u/s

## 2013-11-18 ENCOUNTER — Inpatient Hospital Stay (HOSPITAL_COMMUNITY)
Admission: AD | Admit: 2013-11-18 | Discharge: 2013-11-18 | Disposition: A | Discharge status: Home / Self Care | Payer: No Typology Code available for payment source | Source: Ambulatory Visit | Attending: Obstetrics & Gynecology | Admitting: Obstetrics & Gynecology

## 2013-11-18 ENCOUNTER — Encounter (HOSPITAL_COMMUNITY): Payer: Self-pay

## 2013-11-18 ENCOUNTER — Inpatient Hospital Stay (HOSPITAL_COMMUNITY): Payer: No Typology Code available for payment source

## 2013-11-18 DIAGNOSIS — N920 Excessive and frequent menstruation with regular cycle: Secondary | ICD-10-CM

## 2013-11-18 DIAGNOSIS — Z975 Presence of (intrauterine) contraceptive device: Secondary | ICD-10-CM | POA: Insufficient documentation

## 2013-11-18 DIAGNOSIS — N938 Other specified abnormal uterine and vaginal bleeding: Secondary | ICD-10-CM | POA: Insufficient documentation

## 2013-11-18 DIAGNOSIS — N921 Excessive and frequent menstruation with irregular cycle: Secondary | ICD-10-CM

## 2013-11-18 DIAGNOSIS — N949 Unspecified condition associated with female genital organs and menstrual cycle: Secondary | ICD-10-CM | POA: Insufficient documentation

## 2013-11-18 LAB — URINE MICROSCOPIC-ADD ON

## 2013-11-18 LAB — URINALYSIS, ROUTINE W REFLEX MICROSCOPIC
BILIRUBIN URINE: NEGATIVE
GLUCOSE, UA: NEGATIVE mg/dL
KETONES UR: 15 mg/dL — AB
Leukocytes, UA: NEGATIVE
Nitrite: NEGATIVE
PH: 6 (ref 5.0–8.0)
Protein, ur: NEGATIVE mg/dL
Specific Gravity, Urine: 1.025 (ref 1.005–1.030)
Urobilinogen, UA: 4 mg/dL — ABNORMAL HIGH (ref 0.0–1.0)

## 2013-11-18 LAB — CBC
HCT: 36 % (ref 36.0–46.0)
Hemoglobin: 12 g/dL (ref 12.0–15.0)
MCH: 29.1 pg (ref 26.0–34.0)
MCHC: 33.3 g/dL (ref 30.0–36.0)
MCV: 87.4 fL (ref 78.0–100.0)
PLATELETS: 230 10*3/uL (ref 150–400)
RBC: 4.12 MIL/uL (ref 3.87–5.11)
RDW: 15.8 % — AB (ref 11.5–15.5)
WBC: 6.4 10*3/uL (ref 4.0–10.5)

## 2013-11-18 LAB — WET PREP, GENITAL
CLUE CELLS WET PREP: NONE SEEN
Trich, Wet Prep: NONE SEEN
Yeast Wet Prep HPF POC: NONE SEEN

## 2013-11-18 LAB — POCT PREGNANCY, URINE: Preg Test, Ur: NEGATIVE

## 2013-11-18 MED ORDER — MEDROXYPROGESTERONE ACETATE 10 MG PO TABS
10.0000 mg | ORAL_TABLET | Freq: Every day | ORAL | Status: DC
Start: 2013-11-18 — End: 2013-11-30

## 2013-11-18 MED ORDER — MEDROXYPROGESTERONE ACETATE 10 MG PO TABS: 10.0000 mg | ORAL_TABLET | Freq: Every day | ORAL | Status: DC

## 2013-11-18 NOTE — MAU Provider Note (Signed)
History     CSN: [id number]  Arrival date and time: 11/18/13 0131   First Provider Initiated Contact with Patient 11/18/13 0206      Chief Complaint  Patient presents with  . Vaginal Bleeding   Vaginal Bleeding Pertinent negatives include no headaches.    Pt is a 38 yo G4P4004 here with report of "bleeding like a period" since May 2015.  States IUD was placed a month ago to control bleeding, however it has not worked.  Taking Megace to help control the bleeding.  Pt states using approximately 6-7 pads a day.  Denies pelvic pain.  Appt scheduled with Dr. Toney Rakes on 11/30/13 for ultrasound and blood work.  Past Medical History  Diagnosis Date  . Ovarian cyst   . Constipation   . Headache(784.0)   . Anemia     Past Surgical History  Procedure Laterality Date  . Tubal ligation    . Tubal ligation      Family History  Problem Relation Age of Onset  . Asthma Mother   . Hypertension Mother   . Heart disease Mother     CHF    History  Substance Use Topics  . Smoking status: Never Smoker   . Smokeless tobacco: Never Used  . Alcohol Use: Yes     Comment: Occas.    Allergies: No Known Allergies  Facility-administered medications prior to admission  Medication Dose Route Frequency Provider Last Rate Last Dose  . levonorgestrel (MIRENA) 20 MCG/24HR IUD   Intrauterine Once Terrance Mass, MD       Prescriptions prior to admission  Medication Sig Dispense Refill  . lactulose (CHRONULAC) 10 GM/15ML solution Take 15 mLs (10 g total) by mouth daily as needed for mild constipation.  500 mL  1  . megestrol (MEGACE) 40 MG tablet Take 1 tablet (40 mg total) by mouth 2 (two) times daily.  20 tablet  0  . naproxen sodium (ANAPROX) 220 MG tablet Take 440 mg by mouth 2 (two) times daily as needed (for pain.).      Marland Kitchen phentermine 37.5 MG capsule Take 1 capsule (37.5 mg total) by mouth every morning.  30 capsule  2    Review of Systems  Constitutional: Positive for  malaise/fatigue.  Genitourinary: Positive for vaginal bleeding.       Vaginal bleeding  Neurological: Positive for dizziness. Negative for headaches.  All other systems reviewed and are negative.  Physical Exam   Height 5\' 6"  (1.676 m), weight 98.034 kg (216 lb 2 oz), last menstrual period 09/17/2013.  Physical Exam  Constitutional: She is oriented to person, place, and time. She appears well-developed and well-nourished. No distress.  HENT:  Head: Normocephalic.  Neck: Normal range of motion. Neck supple.  Cardiovascular: Normal rate, regular rhythm and normal heart sounds.   Respiratory: Effort normal and breath sounds normal.  GI: Soft. There is no tenderness.  Genitourinary: There is bleeding (negative clots, scant bleeding) around the vagina.  IUD strings seen from os, normal length apparatus not seen.  However, felt hard object in os when using a swab.    Neurological: She is alert and oriented to person, place, and time. She has normal reflexes.  Skin: Skin is warm and dry.    MAU Course  Procedures 74 Consulted with Dr. Sabra Heck > reviewed HPI/exam/GYN history > obtain CBC and ultrasound to confirm placement  Results for orders placed during the hospital encounter of 11/18/13 (from the past 24 hour(s))  URINALYSIS, ROUTINE  W REFLEX MICROSCOPIC     Status: Abnormal   Collection Time    11/18/13  1:30 AM      Result Value Ref Range   Color, Urine YELLOW  YELLOW   APPearance CLEAR  CLEAR   Specific Gravity, Urine 1.025  1.005 - 1.030   pH 6.0  5.0 - 8.0   Glucose, UA NEGATIVE  NEGATIVE mg/dL   Hgb urine dipstick MODERATE (*) NEGATIVE   Bilirubin Urine NEGATIVE  NEGATIVE   Ketones, ur 15 (*) NEGATIVE mg/dL   Protein, ur NEGATIVE  NEGATIVE mg/dL   Urobilinogen, UA 4.0 (*) 0.0 - 1.0 mg/dL   Nitrite NEGATIVE  NEGATIVE   Leukocytes, UA NEGATIVE  NEGATIVE  URINE MICROSCOPIC-ADD ON     Status: Abnormal   Collection Time    11/18/13  1:30 AM      Result Value Ref Range    Squamous Epithelial / LPF RARE  RARE   WBC, UA 0-2  <3 WBC/hpf   RBC / HPF 11-20  <3 RBC/hpf   Bacteria, UA FEW (*) RARE   Urine-Other MUCOUS PRESENT    POCT PREGNANCY, URINE     Status: None   Collection Time    11/18/13  1:44 AM      Result Value Ref Range   Preg Test, Ur NEGATIVE  NEGATIVE  WET PREP, GENITAL     Status: Abnormal   Collection Time    11/18/13  2:11 AM      Result Value Ref Range   Yeast Wet Prep HPF POC NONE SEEN  NONE SEEN   Trich, Wet Prep NONE SEEN  NONE SEEN   Clue Cells Wet Prep HPF POC NONE SEEN  NONE SEEN   WBC, Wet Prep HPF POC FEW (*) NONE SEEN  CBC     Status: Abnormal   Collection Time    11/18/13  2:35 AM      Result Value Ref Range   WBC 6.4  4.0 - 10.5 K/uL   RBC 4.12  3.87 - 5.11 MIL/uL   Hemoglobin 12.0  12.0 - 15.0 g/dL   HCT 36.0  36.0 - 46.0 %   MCV 87.4  78.0 - 100.0 fL   MCH 29.1  26.0 - 34.0 pg   MCHC 33.3  30.0 - 36.0 g/dL   RDW 15.8 (*) 11.5 - 15.5 %   Platelets 230  150 - 400 K/uL   Ultrasound: IMPRESSION:  Intrauterine device in place. Ovoid solid nodule in the right  endometrial fundus could represent endometrial polyp or mass versus  submucosal leiomyoma projecting into the endometrial cavity. Ovaries  appear normal.  Assessment and Plan  Menorrhagia  Plan: Discharge to home Keep appointment with Dr. Anola Gurney Provera (per pt request) Discontinue Megace  [facility] 11/18/2013, 2:28 AM

## 2013-11-18 NOTE — Discharge Instructions (Signed)
Abnormal Uterine Bleeding Abnormal uterine bleeding can affect women at various stages in life, including teenagers, women in their reproductive years, pregnant women, and women who have reached menopause. Several kinds of uterine bleeding are considered abnormal, including:  Bleeding or spotting between periods.   Bleeding after sexual intercourse.   Bleeding that is heavier or more than normal.   Periods that last longer than usual.  Bleeding after menopause.  Many cases of abnormal uterine bleeding are minor and simple to treat, while others are more serious. Any type of abnormal bleeding should be evaluated by your health care provider. Treatment will depend on the cause of the bleeding. HOME CARE INSTRUCTIONS Monitor your condition for any changes. The following actions may help to alleviate any discomfort you are experiencing:  Avoid the use of tampons and douches as directed by your health care provider.  Change your pads frequently. You should get regular pelvic exams and Pap tests. Keep all follow-up appointments for diagnostic tests as directed by your health care provider.  SEEK MEDICAL CARE IF:   Your bleeding lasts more than 1 week.   You feel dizzy at times.  SEEK IMMEDIATE MEDICAL CARE IF:   You pass out.   You are changing pads every 15 to 30 minutes.   You have abdominal pain.  You have a fever.   You become sweaty or weak.   You are passing large blood clots from the vagina.   You start to feel nauseous and vomit. MAKE SURE YOU:   Understand these instructions.  Will watch your condition.  Will get help right away if you are not doing well or get worse. Document Released: 04/26/2005 Document Revised: 05/01/2013 Document Reviewed: 11/23/2012 ExitCare Patient Information 2015 [employer]. This information is not intended to replace advice given to you by your health care provider. Make sure you discuss any questions you have with your  health care provider.  

## 2013-11-18 NOTE — MAU Note (Signed)
Vaginal bleeding since May and it's heavy every day.  Denies any clots.  Used 2 pads today and 1 tampon last night.  Has Mirena.

## 2013-11-19 LAB — GC/CHLAMYDIA PROBE AMP
CT Probe RNA: NEGATIVE
GC Probe RNA: NEGATIVE

## 2013-11-23 ENCOUNTER — Ambulatory Visit: Payer: No Typology Code available for payment source | Admitting: Gynecology

## 2013-11-23 ENCOUNTER — Other Ambulatory Visit: Payer: Self-pay | Admitting: Gynecology

## 2013-11-23 DIAGNOSIS — Z30431 Encounter for routine checking of intrauterine contraceptive device: Secondary | ICD-10-CM

## 2013-11-30 ENCOUNTER — Other Ambulatory Visit: Payer: Self-pay | Admitting: Gynecology

## 2013-11-30 ENCOUNTER — Ambulatory Visit (INDEPENDENT_AMBULATORY_CARE_PROVIDER_SITE_OTHER): Payer: No Typology Code available for payment source

## 2013-11-30 ENCOUNTER — Encounter: Payer: Self-pay | Admitting: Gynecology

## 2013-11-30 ENCOUNTER — Ambulatory Visit (INDEPENDENT_AMBULATORY_CARE_PROVIDER_SITE_OTHER): Payer: No Typology Code available for payment source | Admitting: Gynecology

## 2013-11-30 DIAGNOSIS — N949 Unspecified condition associated with female genital organs and menstrual cycle: Secondary | ICD-10-CM

## 2013-11-30 DIAGNOSIS — N938 Other specified abnormal uterine and vaginal bleeding: Secondary | ICD-10-CM

## 2013-11-30 DIAGNOSIS — N840 Polyp of corpus uteri: Secondary | ICD-10-CM

## 2013-11-30 DIAGNOSIS — Z30431 Encounter for routine checking of intrauterine contraceptive device: Secondary | ICD-10-CM

## 2013-11-30 MED ORDER — MEGESTROL ACETATE 40 MG PO TABS: 40.0000 mg | ORAL_TABLET | Freq: Two times a day (BID) | ORAL | Status: DC

## 2013-11-30 NOTE — Progress Notes (Signed)
Charlene Mccullough is an 38 y.o. female. For discussion of ultrasound today/sonohysterogram as a result of her persistent dysfunction uterine bleeding despite having been on oral contraceptive pill the past and most recently the Mirena IUD.  Ultrasound today: Uterus measuring 10.7 x 7.3 x 6.3 cm with endometrial stripe of 9.5 mm. Both ovaries were normal. 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 This was confirmed after instilling normal saline into the uterine cavity in a sterile fashion.  Patient is in a same-sex relationship.She denies any past history of abnormal Pap smears or any STDs. Patient states that several years ago in [place] she had a laparoscopic tubal ligation. In April 2014 she had gone to the emergency room complaining of lower, pain and had a CT scan which essentially had only demonstrated bilateral hydronephrosis and mildly dilated bladder but no ureteral obstruction. An incidental finding and demonstrated that a right tubal ligation clip had migrated to the lower right abdomen it was in the paracolic gutter along the inferior tip of the liver. The left clip was noted near the ovary.  Patient's last hemoglobin and hematocrit 11/18/2013 demonstrated hemoglobin 12.0 hematocrit 36.0 and platelet count 230,000.  Patient will be scheduled for resectoscopic polypectomy/myomectomy in an outpatient setting.  Pertinent Gynecological History: Menses: Dysfunctional uterine bleeding Bleeding: Same as above Contraception: IUD DES exposure: denies Blood transfusions: none Sexually transmitted diseases: no past history Previous GYN Procedures: Laparoscopic tubal ligation  Last mammogram: None indicated Date: Not indicated Last pap: normal Date: 2015 OB History: G 4, P 4   Menstrual History: Menarche age: 89 Patient's last menstrual period was 09/17/2013.      Past Medical History  Diagnosis Date  . Ovarian cyst   . Constipation   . Headache(784.0)   . Anemia     Past Surgical History  Procedure Laterality Date  . Tubal ligation    . Tubal ligation      Family History  Problem Relation Age of Onset  . Asthma Mother   . Hypertension Mother   . Heart disease Mother     CHF    Social History:  reports that she has never smoked. She has never used smokeless tobacco. She reports that she drinks alcohol. She reports that she does not use illicit drugs.  Allergies: No Known Allergies   (Not in a hospital admission)  REVIEW OF SYSTEMS: A ROS was performed and pertinent positives and negatives are included in the history.  GENERAL: No fevers or chills. HEENT: No change in vision, no earache, sore throat or sinus congestion. NECK: No pain or stiffness. CARDIOVASCULAR: No chest pain or pressure. No palpitations. PULMONARY: No shortness of breath, cough or wheeze. GASTROINTESTINAL: No abdominal pain, nausea, vomiting or diarrhea, melena or bright red blood per rectum. GENITOURINARY: No urinary frequency, urgency, hesitancy or dysuria. MUSCULOSKELETAL: No joint or muscle pain, no back pain, no recent trauma. DERMATOLOGIC: No rash, no itching, no lesions. ENDOCRINE: No polyuria, polydipsia, no heat or cold intolerance. No recent change in weight. HEMATOLOGICAL: No anemia or easy bruising or bleeding. NEUROLOGIC: No headache, seizures, numbness, tingling or weakness. PSYCHIATRIC: No depression, no loss of interest in normal activity or change in sleep pattern.     Last menstrual period 09/17/2013.  Physical Exam:  HEENT:unremarkable Neck:Supple, midline, no thyroid megaly, no carotid bruits Lungs:  Clear to auscultation no rhonchi's  or wheezes Heart:Regular rate and rhythm, no murmurs or gallops Breast Exam: Examined earlier this year normal Abdomen: Soft nontender no rebound or guarding Pelvic:BUS within normal limits Vagina: Blood was  noted in the vaginal vault Cervix: IUD string was visualized Uterus: 8-10 week size nontender Adnexa: No palpable masses or tenderness Extremities: No cords, no edema Rectal: Not examined   Assessment/Plan: Patient with dysfunction uterine bleeding it appears that the etiologies attributed to 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 The Mirena IUD was removed. She will be started on Megace 40 mg 1 by mouth 3 times a day for the first several days and then continue twice a day until the time of her surgery. She is in a same-sex relationship. Patient will be scheduled for resectoscopic myomectomy/polypectomy now patient setting and the following risks were discussed:                        Patient was counseled as to the risk of surgery to include the following:  1. Infection (prohylactic antibiotics will be administered)  2. DVT/Pulmonary Embolism (prophylactic pneumo compression stockings will be used)  3.Trauma to internal organs requiring additional surgical procedure to repair any injury to     Internal organs requiring perhaps additional hospitalization days.  4.Hemmorhage requiring transfusion and blood products which carry risks such as   anaphylactic reaction, hepatitis and AIDS  Patient had received literature information on the procedure scheduled and all her questions were answered and fully accepts all risk.   [facility] HMD1:13 PMTD@Note : This dictation was prepared with  Dragon/digital dictation along withSmart phrase technology. Any transcriptional errors that result from this process are unintentional.      Terrance Mass 11/30/2013, 1:04 PM  Note: This dictation was prepared with  Dragon/digital dictation along withSmart phrase technology. Any transcriptional errors that result from this process are unintentional.

## 2013-11-30 NOTE — Patient Instructions (Addendum)
Hysteroscopy °Hysteroscopy is a procedure used for looking inside the womb (uterus). It may be done for various reasons, including: °· To evaluate abnormal bleeding, fibroid (benign, noncancerous) tumors, polyps, scar tissue (adhesions), and possibly cancer of the uterus. °· To look for lumps (tumors) and other uterine growths. °· To look for causes of why a woman cannot get pregnant (infertility), causes of recurrent loss of pregnancy (miscarriages), or a lost intrauterine device (IUD). °· To perform a sterilization by blocking the fallopian tubes from inside the uterus. °In this procedure, a thin, flexible tube with a tiny light and camera on the end of it (hysteroscope) is used to look inside the uterus. A hysteroscopy should be done right after a menstrual period to be sure you are not pregnant. °LET YOUR HEALTH CARE PROVIDER KNOW ABOUT:  °· Any allergies you have. °· All medicines you are taking, including vitamins, herbs, eye drops, creams, and over-the-counter medicines. °· Previous problems you or members of your family have had with the use of anesthetics. °· Any blood disorders you have. °· Previous surgeries you have had. °· Medical conditions you have. °RISKS AND COMPLICATIONS  °Generally, this is a safe procedure. However, as with any procedure, complications can occur. Possible complications include: °· Putting a hole in the uterus. °· Excessive bleeding. °· Infection. °· Damage to the cervix. °· Injury to other organs. °· Allergic reaction to medicines. °· Too much fluid used in the uterus for the procedure. °BEFORE THE PROCEDURE  °· Ask your health care provider about changing or stopping any regular medicines. °· Do not take aspirin or blood thinners for 1 week before the procedure, or as directed by your health care provider. These can cause bleeding. °· If you smoke, do not smoke for 2 weeks before the procedure. °· In some cases, a medicine is placed in the cervix the day before the procedure.  This medicine makes the cervix have a larger opening (dilate). This makes it easier for the instrument to be inserted into the uterus during the procedure. °· Do not eat or drink anything for at least 8 hours before the surgery. °· Arrange for someone to take you home after the procedure. °PROCEDURE  °· You may be given a medicine to relax you (sedative). You may also be given one of the following: °¨ A medicine that numbs the area around the cervix (local anesthetic). °¨ A medicine that makes you sleep through the procedure (general anesthetic). °· The hysteroscope is inserted through the vagina into the uterus. The camera on the hysteroscope sends a picture to a TV screen. This gives the surgeon a good view inside the uterus. °· During the procedure, air or a liquid is put into the uterus, which allows the surgeon to see better. °· Sometimes, tissue is gently scraped from inside the uterus. These tissue samples are sent to a lab for testing. °AFTER THE PROCEDURE  °· If you had a general anesthetic, you may be groggy for a couple hours after the procedure. °· If you had a local anesthetic, you will be able to go home as soon as you are stable and feel ready. °· You may have some cramping. This normally lasts for a couple days. °· You may have bleeding, which varies from light spotting for a few days to menstrual-like bleeding for 3-7 days. This is normal. °· If your test results are not back during the visit, make an appointment with your health care provider to find out the   results. Document Released: 08/02/2000 Document Revised: 02/14/2013 Document Reviewed: 11/23/2012 [facility] Patient Information 2015 [place], [place]. This information is not intended to replace advice given to you by your health care provider. Make sure you discuss any questions you have with your health care provider. Dysfunctional Uterine Bleeding Normally, menstrual periods begin between ages 27 to 70 in young women. A normal menstrual  cycle/period may begin every 23 days up to 35 days and lasts from 1 to 7 days. Around 12 to 14 days before your menstrual period starts, ovulation (ovary produces an egg) occurs. When counting the time between menstrual periods, count from the first day of bleeding of the previous period to the first day of bleeding of the next period. Dysfunctional (abnormal) uterine bleeding is bleeding that is different from a normal menstrual period. Your periods may come earlier or later than usual. They may be lighter, have blood clots or be heavier. You may have bleeding between periods, or you may skip one period or more. You may have bleeding after sexual intercourse, bleeding after menopause, or no menstrual period. CAUSES   Pregnancy (normal, miscarriage, tubal).  IUDs (intrauterine device, birth control).  Birth control pills.  Hormone treatment.  Menopause.  Infection of the cervix.  Blood clotting problems.  Infection of the inside lining of the uterus.  Endometriosis, inside lining of the uterus growing in the pelvis and other female organs.  Adhesions (scar tissue) inside the uterus.  Obesity or severe weight loss.  Uterine polyps inside the uterus.  Cancer of the vagina, cervix, or uterus.  Ovarian cysts or polycystic ovary syndrome.  Medical problems (diabetes, thyroid disease).  Uterine fibroids (noncancerous tumor).  Problems with your female hormones.  Endometrial hyperplasia, very thick lining and enlarged cells inside of the uterus.  Medicines that interfere with ovulation.  Radiation to the pelvis or abdomen.  Chemotherapy. DIAGNOSIS   Your doctor will discuss the history of your menstrual periods, medicines you are taking, changes in your weight, stress in your life, and any medical problems you may have.  Your doctor will do a physical and pelvic examination.  Your doctor may want to perform certain tests to make a diagnosis, such as:  Pap test.  Blood  tests.  Cultures for infection.  CT scan.  Ultrasound.  Hysteroscopy.  Laparoscopy.  MRI.  Hysterosalpingography.  D and C.  Endometrial biopsy. TREATMENT  Treatment will depend on the cause of the dysfunctional uterine bleeding (DUB). Treatment may include:  Observing your menstrual periods for a couple of months.  Prescribing medicines for medical problems, including:  Antibiotics.  Hormones.  Birth control pills.  Removing an IUD (intrauterine device, birth control).  Surgery:  D and C (scrape and remove tissue from inside the uterus).  Laparoscopy (examine inside the abdomen with a lighted tube).  Uterine ablation (destroy lining of the uterus with electrical current, laser, heat, or freezing).  Hysteroscopy (examine cervix and uterus with a lighted tube).  Hysterectomy (remove the uterus). HOME CARE INSTRUCTIONS   If medicines were prescribed, take exactly as directed. Do not change or switch medicines without consulting your caregiver.  Long term heavy bleeding may result in iron deficiency. Your caregiver may have prescribed iron pills. They help replace the iron that your body lost from heavy bleeding. Take exactly as directed.  Do not take aspirin or medicines that contain aspirin one week before or during your menstrual period. Aspirin may make the bleeding worse.  If you need to change your sanitary  pad or tampon more than once every 2 hours, stay in bed with your feet elevated and a cold pack on your lower abdomen. Rest as much as possible, until the bleeding stops or slows down.  Eat well-balanced meals. Eat foods high in iron. Examples are:  Leafy green vegetables.  Whole-grain breads and cereals.  Eggs.  Meat.  Liver.  Do not try to lose weight until the abnormal bleeding has stopped and your blood iron level is back to normal. Do not lift more than ten pounds or do strenuous activities when you are bleeding.  For a couple of months,  make note on your calendar, marking the start and ending of your period, and the type of bleeding (light, medium, heavy, spotting, clots or missed periods). This is for your caregiver to better evaluate your problem. SEEK MEDICAL CARE IF:   You develop nausea (feeling sick to your stomach) and vomiting, dizziness, or diarrhea while you are taking your medicine.  You are getting lightheaded or weak.  You have any problems that may be related to the medicine you are taking.  You develop pain with your DUB.  You want to remove your IUD.  You want to stop or change your birth control pills or hormones.  You have any type of abnormal bleeding mentioned above.  You are over 74 years old and have not had a menstrual period yet.  You are 38 years old and you are still having menstrual periods.  You have any of the symptoms mentioned above.  You develop a rash. SEEK IMMEDIATE MEDICAL CARE IF:   An oral temperature above 102 F (38.9 C) develops.  You develop chills.  You are changing your sanitary pad or tampon more than once an hour.  You develop abdominal pain.  You pass out or faint. Document Released: 04/23/2000 Document Revised: 07/19/2011 Document Reviewed: 03/25/2009 [facility] Patient Information 2015 [place], [place]. This information is not intended to replace advice given to you by your health care provider. Make sure you discuss any questions you have with your health care provider.

## 2013-12-06 ENCOUNTER — Telehealth: Payer: Self-pay

## 2013-12-06 NOTE — Telephone Encounter (Signed)
Patient called today to confirm that she can do surgery Monday August 3 1:00pm at [facility] as scheduled. She made her surgery prepymt. She knows she will hear from [facility] with all her instructions. I called and left msg with Marcie Bal in admitting that patient still has not heard from [facility].

## 2013-12-07 ENCOUNTER — Encounter (HOSPITAL_COMMUNITY): Payer: Self-pay

## 2013-12-10 ENCOUNTER — Encounter (HOSPITAL_COMMUNITY): Payer: No Typology Code available for payment source | Admitting: Anesthesiology

## 2013-12-10 ENCOUNTER — Encounter (HOSPITAL_COMMUNITY)
Admission: RE | Disposition: A | Discharge status: Home / Self Care | Payer: Self-pay | Source: Ambulatory Visit | Attending: Gynecology

## 2013-12-10 ENCOUNTER — Ambulatory Visit (HOSPITAL_COMMUNITY)
Admission: RE | Admit: 2013-12-10 | Discharge: 2013-12-10 | Disposition: A | Discharge status: Home / Self Care | Payer: No Typology Code available for payment source | Source: Ambulatory Visit | Attending: Gynecology | Admitting: Gynecology

## 2013-12-10 ENCOUNTER — Encounter (HOSPITAL_COMMUNITY): Payer: Self-pay | Admitting: Anesthesiology

## 2013-12-10 ENCOUNTER — Ambulatory Visit (HOSPITAL_COMMUNITY): Payer: No Typology Code available for payment source | Admitting: Anesthesiology

## 2013-12-10 DIAGNOSIS — N925 Other specified irregular menstruation: Secondary | ICD-10-CM | POA: Insufficient documentation

## 2013-12-10 DIAGNOSIS — Z8249 Family history of ischemic heart disease and other diseases of the circulatory system: Secondary | ICD-10-CM | POA: Insufficient documentation

## 2013-12-10 DIAGNOSIS — N938 Other specified abnormal uterine and vaginal bleeding: Secondary | ICD-10-CM | POA: Insufficient documentation

## 2013-12-10 DIAGNOSIS — D649 Anemia, unspecified: Secondary | ICD-10-CM | POA: Insufficient documentation

## 2013-12-10 DIAGNOSIS — N854 Malposition of uterus: Secondary | ICD-10-CM | POA: Insufficient documentation

## 2013-12-10 DIAGNOSIS — N949 Unspecified condition associated with female genital organs and menstrual cycle: Secondary | ICD-10-CM

## 2013-12-10 DIAGNOSIS — N840 Polyp of corpus uteri: Secondary | ICD-10-CM | POA: Insufficient documentation

## 2013-12-10 DIAGNOSIS — D259 Leiomyoma of uterus, unspecified: Secondary | ICD-10-CM

## 2013-12-10 DIAGNOSIS — Z9889 Other specified postprocedural states: Secondary | ICD-10-CM

## 2013-12-10 DIAGNOSIS — D250 Submucous leiomyoma of uterus: Secondary | ICD-10-CM | POA: Insufficient documentation

## 2013-12-10 DIAGNOSIS — R51 Headache: Secondary | ICD-10-CM | POA: Insufficient documentation

## 2013-12-10 LAB — CBC
HEMATOCRIT: 36.9 % (ref 36.0–46.0)
HEMOGLOBIN: 12.3 g/dL (ref 12.0–15.0)
MCH: 29.2 pg (ref 26.0–34.0)
MCHC: 33.3 g/dL (ref 30.0–36.0)
MCV: 87.6 fL (ref 78.0–100.0)
Platelets: 234 10*3/uL (ref 150–400)
RBC: 4.21 MIL/uL (ref 3.87–5.11)
RDW: 15.4 % (ref 11.5–15.5)
WBC: 5.7 10*3/uL (ref 4.0–10.5)

## 2013-12-10 SURGERY — DILATATION & CURETTAGE/HYSTEROSCOPY WITH MYOSURE
Anesthesia: General | Site: Uterus

## 2013-12-10 MED ORDER — SODIUM CHLORIDE 0.9 % IJ SOLN
INTRAMUSCULAR | Status: DC | PRN
  Administered 2013-12-10: 30 mL

## 2013-12-10 MED ORDER — LACTATED RINGERS IV SOLN
INTRAVENOUS | Status: DC
  Administered 2013-12-10: 12:00:00 via INTRAVENOUS

## 2013-12-10 MED ORDER — ONDANSETRON HCL 4 MG/2ML IJ SOLN
INTRAMUSCULAR | Status: AC
  Filled 2013-12-10: qty 2

## 2013-12-10 MED ORDER — MIDAZOLAM HCL 2 MG/2ML IJ SOLN
INTRAMUSCULAR | Status: AC
  Filled 2013-12-10: qty 2

## 2013-12-10 MED ORDER — PROPOFOL 10 MG/ML IV BOLUS
INTRAVENOUS | Status: DC | PRN
  Administered 2013-12-10: 200 mg via INTRAVENOUS

## 2013-12-10 MED ORDER — METOCLOPRAMIDE HCL 10 MG PO TABS: 10.0000 mg | ORAL_TABLET | Freq: Three times a day (TID) | ORAL | Status: DC

## 2013-12-10 MED ORDER — MEPERIDINE HCL 25 MG/ML IJ SOLN: 6.2500 mg | INTRAMUSCULAR | Status: DC | PRN

## 2013-12-10 MED ORDER — SODIUM CHLORIDE 0.9 % IJ SOLN
INTRAMUSCULAR | Status: AC
  Filled 2013-12-10: qty 10

## 2013-12-10 MED ORDER — SODIUM CHLORIDE 0.9 % IR SOLN
Status: DC | PRN
  Administered 2013-12-10: 1

## 2013-12-10 MED ORDER — FENTANYL CITRATE 0.05 MG/ML IJ SOLN
INTRAMUSCULAR | Status: AC
  Filled 2013-12-10: qty 5

## 2013-12-10 MED ORDER — PROMETHAZINE HCL 25 MG/ML IJ SOLN
6.2500 mg | INTRAMUSCULAR | Status: DC | PRN
Start: 2013-12-10 — End: 2013-12-10

## 2013-12-10 MED ORDER — FENTANYL CITRATE 0.05 MG/ML IJ SOLN
INTRAMUSCULAR | Status: DC | PRN
  Administered 2013-12-10: 50 ug via INTRAVENOUS
  Administered 2013-12-10: 100 ug via INTRAVENOUS
  Administered 2013-12-10: 50 ug via INTRAVENOUS

## 2013-12-10 MED ORDER — SODIUM CHLORIDE 0.9 % IJ SOLN
INTRAMUSCULAR | Status: AC
  Filled 2013-12-10: qty 50

## 2013-12-10 MED ORDER — KETOROLAC TROMETHAMINE 30 MG/ML IJ SOLN
INTRAMUSCULAR | Status: AC
  Filled 2013-12-10: qty 1

## 2013-12-10 MED ORDER — OXYCODONE-ACETAMINOPHEN 5-325 MG PO TABS: 1.0000 | ORAL_TABLET | ORAL | Status: DC | PRN

## 2013-12-10 MED ORDER — GLYCOPYRROLATE 0.2 MG/ML IJ SOLN
INTRAMUSCULAR | Status: DC | PRN
  Administered 2013-12-10: 0.1 mg via INTRAVENOUS

## 2013-12-10 MED ORDER — CEFAZOLIN SODIUM-DEXTROSE 2-3 GM-% IV SOLR
INTRAVENOUS | Status: AC
  Administered 2013-12-10: 2 g via INTRAVENOUS
  Filled 2013-12-10: qty 50

## 2013-12-10 MED ORDER — VASOPRESSIN 20 UNIT/ML IJ SOLN
INTRAMUSCULAR | Status: DC | PRN
  Administered 2013-12-10: 10 [IU]

## 2013-12-10 MED ORDER — DEXAMETHASONE SODIUM PHOSPHATE 10 MG/ML IJ SOLN
INTRAMUSCULAR | Status: AC
  Filled 2013-12-10: qty 1

## 2013-12-10 MED ORDER — FENTANYL CITRATE 0.05 MG/ML IJ SOLN
25.0000 ug | INTRAMUSCULAR | Status: DC | PRN
  Administered 2013-12-10: 50 ug via INTRAVENOUS

## 2013-12-10 MED ORDER — KETOROLAC TROMETHAMINE 30 MG/ML IJ SOLN: 15.0000 mg | Freq: Once | INTRAMUSCULAR | Status: DC | PRN

## 2013-12-10 MED ORDER — VASOPRESSIN 20 UNIT/ML IJ SOLN
INTRAMUSCULAR | Status: AC
  Filled 2013-12-10: qty 1

## 2013-12-10 MED ORDER — FENTANYL CITRATE 0.05 MG/ML IJ SOLN
INTRAMUSCULAR | Status: AC
  Administered 2013-12-10: 50 ug via INTRAVENOUS
  Filled 2013-12-10: qty 2

## 2013-12-10 MED ORDER — PROPOFOL 10 MG/ML IV EMUL
INTRAVENOUS | Status: AC
  Filled 2013-12-10: qty 20

## 2013-12-10 MED ORDER — LIDOCAINE HCL (CARDIAC) 20 MG/ML IV SOLN
INTRAVENOUS | Status: AC
  Filled 2013-12-10: qty 5

## 2013-12-10 MED ORDER — ONDANSETRON HCL 4 MG/2ML IJ SOLN
INTRAMUSCULAR | Status: DC | PRN
  Administered 2013-12-10: 4 mg via INTRAVENOUS

## 2013-12-10 MED ORDER — MIDAZOLAM HCL 5 MG/5ML IJ SOLN
INTRAMUSCULAR | Status: DC | PRN
  Administered 2013-12-10: 2 mg via INTRAVENOUS

## 2013-12-10 MED ORDER — DEXAMETHASONE SODIUM PHOSPHATE 10 MG/ML IJ SOLN
INTRAMUSCULAR | Status: DC | PRN
  Administered 2013-12-10: 4 mg via INTRAVENOUS

## 2013-12-10 MED ORDER — MIDAZOLAM HCL 2 MG/2ML IJ SOLN: 0.5000 mg | Freq: Once | INTRAMUSCULAR | Status: DC | PRN

## 2013-12-10 MED ORDER — SODIUM CHLORIDE 0.9 % IJ SOLN
INTRAMUSCULAR | Status: AC
  Filled 2013-12-10: qty 30

## 2013-12-10 MED ORDER — KETOROLAC TROMETHAMINE 30 MG/ML IJ SOLN
INTRAMUSCULAR | Status: DC | PRN
  Administered 2013-12-10: 30 mg via INTRAVENOUS

## 2013-12-10 MED ORDER — GLYCOPYRROLATE 0.2 MG/ML IJ SOLN
INTRAMUSCULAR | Status: AC
  Filled 2013-12-10: qty 1

## 2013-12-10 MED ORDER — LIDOCAINE HCL (CARDIAC) 20 MG/ML IV SOLN
INTRAVENOUS | Status: DC | PRN
  Administered 2013-12-10: 50 mg via INTRAVENOUS

## 2013-12-10 MED ORDER — SCOPOLAMINE 1 MG/3DAYS TD PT72
1.0000 | MEDICATED_PATCH | Freq: Once | TRANSDERMAL | Status: DC
  Administered 2013-12-10: 1.5 mg via TRANSDERMAL

## 2013-12-10 MED ORDER — SCOPOLAMINE 1 MG/3DAYS TD PT72
MEDICATED_PATCH | TRANSDERMAL | Status: AC
  Filled 2013-12-10: qty 1

## 2013-12-10 SURGICAL SUPPLY — 20 items
CANISTERS HI-FLOW 3000CC (CANNISTER) ×3 IMPLANT
CATH ROBINSON RED A/P 16FR (CATHETERS) ×3 IMPLANT
CLOTH BEACON ORANGE TIMEOUT ST (SAFETY) ×3 IMPLANT
CONTAINER PREFILL 10% NBF 60ML (FORM) ×6 IMPLANT
DEVICE MYOSURE CLASSIC (MISCELLANEOUS) ×3 IMPLANT
DRAPE HYSTEROSCOPY (DRAPE) ×3 IMPLANT
FILTER ARTHROSCOPY CONVERTOR (FILTER) ×3 IMPLANT
GLOVE BIOGEL PI IND STRL 8 (GLOVE) ×1 IMPLANT
GLOVE BIOGEL PI INDICATOR 8 (GLOVE) ×2
GLOVE ECLIPSE 7.5 STRL STRAW (GLOVE) ×6 IMPLANT
GOWN STRL REUS W/TWL LRG LVL3 (GOWN DISPOSABLE) ×6 IMPLANT
PACK VAGINAL MINOR WOMEN LF (CUSTOM PROCEDURE TRAY) ×3 IMPLANT
PAD OB MATERNITY 4.3X12.25 (PERSONAL CARE ITEMS) ×3 IMPLANT
PAD PREP 24X48 CUFFED NSTRL (MISCELLANEOUS) ×3 IMPLANT
SEAL ROD LENS SCOPE MYOSURE (ABLATOR) ×3 IMPLANT
SET TUBING HYSTEROSCOPY 2 NDL (TUBING) ×3 IMPLANT
SYR 30ML LL (SYRINGE) ×3 IMPLANT
TOWEL OR 17X24 6PK STRL BLUE (TOWEL DISPOSABLE) ×6 IMPLANT
TUBE HYSTEROSCOPY W Y-CONNECT (TUBING) ×3 IMPLANT
WATER STERILE IRR 1000ML POUR (IV SOLUTION) ×3 IMPLANT

## 2013-12-10 NOTE — H&P (View-Only) (Signed)
Charlene Mccullough is an 38 y.o. female. For discussion of ultrasound today/sonohysterogram as a result of her persistent dysfunction uterine bleeding despite having been on oral contraceptive pill the past and most recently the Mirena IUD.  Ultrasound today: Uterus measuring 10.7 x 7.3 x 6.3 cm with endometrial stripe of 9.5 mm. Both ovaries were normal. 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 This was confirmed after instilling normal saline into the uterine cavity in a sterile fashion.  Patient is in a same-sex relationship.She denies any past history of abnormal Pap smears or any STDs. Patient states that several years ago in [place] she had a laparoscopic tubal ligation. In April 2014 she had gone to the emergency room complaining of lower, pain and had a CT scan which essentially had only demonstrated bilateral hydronephrosis and mildly dilated bladder but no ureteral obstruction. An incidental finding and demonstrated that a right tubal ligation clip had migrated to the lower right abdomen it was in the paracolic gutter along the inferior tip of the liver. The left clip was noted near the ovary.  Patient's last hemoglobin and hematocrit 11/18/2013 demonstrated hemoglobin 12.0 hematocrit 36.0 and platelet count 230,000.  Patient will be scheduled for resectoscopic polypectomy/myomectomy in an outpatient setting.  Pertinent Gynecological History: Menses: Dysfunctional uterine bleeding Bleeding: Same as above Contraception: IUD DES exposure: denies Blood transfusions: none Sexually transmitted diseases: no past history Previous GYN Procedures: Laparoscopic tubal ligation  Last mammogram: None indicated Date: Not indicated Last pap: normal Date: 2015 OB History: G 4, P 4   Menstrual History: Menarche age: 55 Patient's last menstrual period was 09/17/2013.      Past Medical History  Diagnosis Date  . Ovarian cyst   . Constipation   . Headache(784.0)   . Anemia     Past Surgical History  Procedure Laterality Date  . Tubal ligation    . Tubal ligation      Family History  Problem Relation Age of Onset  . Asthma Mother   . Hypertension Mother   . Heart disease Mother     CHF    Social History:  reports that she has never smoked. She has never used smokeless tobacco. She reports that she drinks alcohol. She reports that she does not use illicit drugs.  Allergies: No Known Allergies   (Not in a hospital admission)  REVIEW OF SYSTEMS: A ROS was performed and pertinent positives and negatives are included in the history.  GENERAL: No fevers or chills. HEENT: No change in vision, no earache, sore throat or sinus congestion. NECK: No pain or stiffness. CARDIOVASCULAR: No chest pain or pressure. No palpitations. PULMONARY: No shortness of breath, cough or wheeze. GASTROINTESTINAL: No abdominal pain, nausea, vomiting or diarrhea, melena or bright red blood per rectum. GENITOURINARY: No urinary frequency, urgency, hesitancy or dysuria. MUSCULOSKELETAL: No joint or muscle pain, no back pain, no recent trauma. DERMATOLOGIC: No rash, no itching, no lesions. ENDOCRINE: No polyuria, polydipsia, no heat or cold intolerance. No recent change in weight. HEMATOLOGICAL: No anemia or easy bruising or bleeding. NEUROLOGIC: No headache, seizures, numbness, tingling or weakness. PSYCHIATRIC: No depression, no loss of interest in normal activity or change in sleep pattern.     Last menstrual period 09/17/2013.  Physical Exam:  HEENT:unremarkable Neck:Supple, midline, no thyroid megaly, no carotid bruits Lungs:  Clear to auscultation no rhonchi's  or wheezes Heart:Regular rate and rhythm, no murmurs or gallops Breast Exam: Examined earlier this year normal Abdomen: Soft nontender no rebound or guarding Pelvic:BUS within normal limits Vagina: Blood was  noted in the vaginal vault Cervix: IUD string was visualized Uterus: 8-10 week size nontender Adnexa: No palpable masses or tenderness Extremities: No cords, no edema Rectal: Not examined   Assessment/Plan: Patient with dysfunction uterine bleeding it appears that the etiologies attributed to 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 The Mirena IUD was removed. She will be started on Megace 40 mg 1 by mouth 3 times a day for the first several days and then continue twice a day until the time of her surgery. She is in a same-sex relationship. Patient will be scheduled for resectoscopic myomectomy/polypectomy now patient setting and the following risks were discussed:                        Patient was counseled as to the risk of surgery to include the following:  1. Infection (prohylactic antibiotics will be administered)  2. DVT/Pulmonary Embolism (prophylactic pneumo compression stockings will be used)  3.Trauma to internal organs requiring additional surgical procedure to repair any injury to     Internal organs requiring perhaps additional hospitalization days.  4.Hemmorhage requiring transfusion and blood products which carry risks such as   anaphylactic reaction, hepatitis and AIDS  Patient had received literature information on the procedure scheduled and all her questions were answered and fully accepts all risk.   [facility] HMD1:13 PMTD@Note : This dictation was prepared with  Dragon/digital dictation along withSmart phrase technology. Any transcriptional errors that result from this process are unintentional.      Terrance Mass 11/30/2013, 1:04 PM  Note: This dictation was prepared with  Dragon/digital dictation along withSmart phrase technology. Any transcriptional errors that result from this process are unintentional.

## 2013-12-10 NOTE — Interval H&P Note (Signed)
History and Physical Interval Note:  12/10/2013 1:00 PM  Charlene Mccullough  has presented today for surgery, with the diagnosis of Dysfunctional uterine bleeding, myoma, and polyp  The various methods of treatment have been discussed with the patient and family. After consideration of risks, benefits and other options for treatment, the patient has consented to  Procedure(s): RESECTOSCOPIC POLYPECTOMY AND MYOMECTOMY WITH MYOSURE (N/A) as a surgical intervention .  The patient's history has been reviewed, patient examined, no change in status, stable for surgery.  I have reviewed the patient's chart and labs.  Questions were answered to the patient's satisfaction.     Terrance Mass

## 2013-12-10 NOTE — Anesthesia Postprocedure Evaluation (Signed)
Anesthesia Post Note  Patient: Charlene Mccullough  Procedure(s) Performed: Procedure(s) (LRB): RESECTOSCOPIC POLYPECTOMY AND MYOMECTOMY WITH MYOSURE (N/A)  Anesthesia type: GA  Patient location: PACU  Post pain: Pain level controlled  Post assessment: Post-op Vital signs reviewed  Last Vitals:  Filed Vitals:   12/10/13 1422  BP: 127/90  Pulse: 100  Temp: 36.9 C  Resp: 17    Post vital signs: Reviewed  Level of consciousness: sedated  Complications: No apparent anesthesia complications

## 2013-12-10 NOTE — Anesthesia Preprocedure Evaluation (Signed)
Anesthesia Evaluation  Patient identified by MRN, date of birth, ID band Patient awake    Reviewed: Allergy & Precautions, H&P , Patient's Chart, lab work & pertinent test results, reviewed documented beta blocker date and time   History of Anesthesia Complications Negative for: history of anesthetic complications  Airway Mallampati: II  TM Distance: >3 FB Neck ROM: full    Dental   Pulmonary  breath sounds clear to auscultation        Cardiovascular Exercise Tolerance: Good Rhythm:regular Rate:Normal     Neuro/Psych negative psych ROS   GI/Hepatic   Endo/Other    Renal/GU      Musculoskeletal   Abdominal   Peds  Hematology   Anesthesia Other Findings   Reproductive/Obstetrics                             Anesthesia Physical Anesthesia Plan  ASA: II  Anesthesia Plan: General LMA   Post-op Pain Management:    Induction:   Airway Management Planned:   Additional Equipment:   Intra-op Plan:   Post-operative Plan:   Informed Consent: I have reviewed the patients History and Physical, chart, labs and discussed the procedure including the risks, benefits and alternatives for the proposed anesthesia with the patient or authorized representative who has indicated his/her understanding and acceptance.   Dental Advisory Given  Plan Discussed with: CRNA, Surgeon and Anesthesiologist  Anesthesia Plan Comments:         Anesthesia Quick Evaluation  

## 2013-12-10 NOTE — Transfer of Care (Signed)
Immediate Anesthesia Transfer of Care Note  Patient: Charlene Mccullough  Procedure(s) Performed: Procedure(s): RESECTOSCOPIC POLYPECTOMY AND MYOMECTOMY WITH MYOSURE (N/A)  Patient Location: PACU  Anesthesia Type:General  Level of Consciousness: awake, alert , oriented and patient cooperative  Airway & Oxygen Therapy: Patient Spontanous Breathing and Patient connected to nasal cannula oxygen  Post-op Assessment: Report given to PACU RN, Post -op Vital signs reviewed and stable and Patient moving all extremities X 4  Post vital signs: Reviewed and stable  Complications: No apparent anesthesia complications

## 2013-12-10 NOTE — Op Note (Signed)
   Operative Note  12/10/2013  2:15 PM  PATIENT:  Charlene Mccullough  38 y.o. female  PRE-OPERATIVE DIAGNOSIS:  Dysfunctional uterine bleeding, myoma, and polyp  POST-OPERATIVE DIAGNOSIS:  Dysfunctional uterine bleeding, myoma, and polyp  PROCEDURE:  Procedure(s): RESECTOSCOPIC POLYPECTOMY AND MYOMECTOMY WITH MYOSURE  SURGEON:  Surgeon(s): Terrance Mass, MD  ANESTHESIA:   general  FINDINGS: A large submucous myoma with base coming off the 8:00 position lower uterine segment. Remainder of the uterine cavity was normal. Both tubal she was identified. A smooth endocervical canal.  DESCRIPTION OF OPERATION:FINDINGS: The patient was taken to the operating room where she underwent a successful general endotracheal anesthesia. Patient had PAS stockings for DVT prophylaxis. She received 2 g of Ancef preop. Time out was undertaken to properly identify the patient and the proper operation schedule. The vagina and perineum were prepped and draped in usual sterile fashion. A red rubber Quentin Cornwall was inserted to empty the bladder is content for approximately 50 cc. Bimanual examination demonstrated an anteverted uterus. Patient's legs were in the high lithotomy position. A weighted speculum was placed in the posterior vaginal vault. A single-tooth tenaculum was placed on the anterior cervical lip. The uterus sounded to 8-1/2 cm. Pratt dilator were used to dilate the cervical canal to a 23 mm size. The Hologic Myosure resectoscopic morcellator with a scope of 6.25 mm and an operating blade of 3.0 mm was introduced into the intrauterine cavity. 0.9% normal saline was the distending media. Inspection of the endometrial cavity demonstrated a large submucous myoma hypervascular with the base coming off the 8:00 position near the lower uterine segment. With the resectoscopic morcellator the submucous myoma was removed and submitted for histological evaluation. Fluid deficit was approximately 200 cc.  The single-tooth  tenaculum was removed patient was extubated transferred to recovery room stable vital signs blood loss was minimal. She received 30 mg of Toradol in route to the recovery room.   ESTIMATED BLOOD LOSS: Minimal   Intake/Output Summary (Last 24 hours) at 12/10/13 1415 Last data filed at 12/10/13 1345  Gross per 24 hour  Intake   1000 ml  Output     75 ml  Net    925 ml     BLOOD ADMINISTERED:none   LOCAL MEDICATIONS USED:  OTHER Pitressin 0.5 unit/30 cc of normal saline administered 10 cc intracervically at the 2, 4, 8 and 10:00 position  SPECIMEN:  Source of Specimen:  Submucous myoma  DISPOSITION OF SPECIMEN:  PATHOLOGY  COUNTS:  YES  PLAN OF CARE: Transfer to [facility] HMD2:15 PMTD@  Note: This dictation was prepared with  Dragon/digital dictation along withSmart phrase technology. Any transcriptional errors that result from this process are unintentional.

## 2013-12-10 NOTE — Discharge Instructions (Signed)
DISCHARGE INSTRUCTIONS: D&C-Rectoscopic Myomectomy/polypectomy The following instructions have been prepared to help you care for yourself upon your return home.  MAY TAKE IBUPROFEN (MOTRIN, ADVIL) OR ALEVE AFTER 8:00 PM FOR CRAMPS!!!   Personal hygiene:  Use sanitary pads for vaginal drainage, not tampons.  Shower the day after your procedure.  NO tub baths, pools or Jacuzzis for 2-3 weeks.  Wipe front to back after using the bathroom.  Activity and limitations:  Do NOT drive or operate any equipment for 24 hours. The effects of anesthesia are still present and drowsiness may result.  Do NOT rest in bed all day.  Walking is encouraged.  Walk up and down stairs slowly.  You may resume your normal activity in one to two days or as indicated by your physician.  Sexual activity: NO intercourse for at least 2 weeks after the procedure, or as indicated by your physician.  Diet: Eat a light meal as desired this evening. You may resume your usual diet tomorrow.  Return to work: You may resume your work activities in one to two days or as indicated by your doctor.  What to expect after your surgery: Expect to have vaginal bleeding/discharge for 2-3 days and spotting for up to 10 days. It is not unusual to have soreness for up to 1-2 weeks. You may have a slight burning sensation when you urinate for the first day. Mild cramps may continue for a couple of days. You may have a regular period in 2-6 weeks.  Call your doctor for any of the following:  Excessive vaginal bleeding, saturating and changing one pad every hour.  Inability to urinate 6 hours after discharge from hospital.  Pain not relieved by pain medication.  Fever of 100.4 F or greater.  Unusual vaginal discharge or odor.   Call for an appointment:    Patients signature: ______________________  Nurses signature ________________________  Support person's signature_______________________

## 2013-12-11 ENCOUNTER — Telehealth: Payer: Self-pay

## 2013-12-11 NOTE — Telephone Encounter (Signed)
She can finish off for which she has

## 2013-12-11 NOTE — Telephone Encounter (Signed)
Patient informed. 

## 2013-12-11 NOTE — Telephone Encounter (Signed)
Patient has surgery yesterday. She questioned if she should still be taking the Megace?

## 2013-12-13 SURGERY — Surgical Case
Anesthesia: *Unknown

## 2013-12-24 ENCOUNTER — Ambulatory Visit: Payer: No Typology Code available for payment source | Admitting: Gynecology

## 2013-12-26 ENCOUNTER — Ambulatory Visit (INDEPENDENT_AMBULATORY_CARE_PROVIDER_SITE_OTHER): Payer: No Typology Code available for payment source | Admitting: Gynecology

## 2013-12-26 ENCOUNTER — Encounter: Payer: Self-pay | Admitting: Gynecology

## 2013-12-26 VITALS — BP 116/74

## 2013-12-26 DIAGNOSIS — Z09 Encounter for follow-up examination after completed treatment for conditions other than malignant neoplasm: Secondary | ICD-10-CM

## 2013-12-26 NOTE — Progress Notes (Signed)
   Patient is a 38 year old in a same-sex relationship who presents to the office for her two-week postop visit. Patient is status post resectoscopic myomectomy and polypectomy on 12/10/2013 is a result of dysfunction uterine bleeding as well as submucous myoma and endometrial polyp. Patient had had anemia as a result but had been taking iron supplementation and her preop hemoglobin was normal. She was also on Megace 40 mg twice a day until yesterday. She is doing well otherwise. Findings from her surgery as well as pictures were shared with the patient. 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 - THERE IS NO EVIDENCE OF HYPERPLASIA OR MALIGNANCY.  The patient prior to her surgery had a Mirena IUD for cycle control until the submucous myoma and endometrial polyp had been identified preoperatively. Since she is in a same-sex relationship she would not need a Mirena IUD and will monitor her cycles for the next few months. Otherwise will see her back next year for annual exam or when necessary.

## 2014-03-11 ENCOUNTER — Encounter: Payer: Self-pay | Admitting: Gynecology

## 2014-04-16 ENCOUNTER — Ambulatory Visit (INDEPENDENT_AMBULATORY_CARE_PROVIDER_SITE_OTHER): Payer: No Typology Code available for payment source | Admitting: Gynecology

## 2014-04-16 ENCOUNTER — Encounter: Payer: Self-pay | Admitting: Gynecology

## 2014-04-16 VITALS — BP 128/84

## 2014-04-16 DIAGNOSIS — Z113 Encounter for screening for infections with a predominantly sexual mode of transmission: Secondary | ICD-10-CM

## 2014-04-16 DIAGNOSIS — N920 Excessive and frequent menstruation with regular cycle: Secondary | ICD-10-CM | POA: Insufficient documentation

## 2014-04-16 DIAGNOSIS — R635 Abnormal weight gain: Secondary | ICD-10-CM

## 2014-04-16 DIAGNOSIS — N946 Dysmenorrhea, unspecified: Secondary | ICD-10-CM | POA: Insufficient documentation

## 2014-04-16 DIAGNOSIS — N9089 Other specified noninflammatory disorders of vulva and perineum: Secondary | ICD-10-CM

## 2014-04-16 DIAGNOSIS — A6000 Herpesviral infection of urogenital system, unspecified: Secondary | ICD-10-CM

## 2014-04-16 MED ORDER — LEVONORGESTREL-ETHINYL ESTRAD 0.1-20 MG-MCG PO TABS: ORAL_TABLET | ORAL | Status: AC

## 2014-04-16 MED ORDER — NALTREXONE-BUPROPION HCL ER 8-90 MG PO TB12: ORAL_TABLET | ORAL | Status: DC

## 2014-04-16 NOTE — Progress Notes (Signed)
   Patient is a 38 year old who presented to the office today with several complaints. She was complaining that her cycles are starting to be heavier this last one lasted for proximally 7 days. She was also complaining of having noted of vaginal irritation which appeared several days ago. She states this has occurred in the past on and off. On questioning she mentioned that several years ago she was tested for herpes and was positive and then another provider retested her and was negative so she was confused. She is in a same-sex relationship. Review of her record and indicated that she was seen in the office on August 19 for her two-week postop visit she was status post resectoscopic polypectomy and pathology report demonstrated benign endometrial polyp as well as leiomyoma from the submucous myoma that was also removed at the same time. Patient also was concerned about her weight gain. She had been placed on phentermine 37.5 mg 1 by mouth daily in the past for 3 months and lost approximately 15 pounds the patient still overweight.  Exam: Bartholin urethra Skene was within normal limits the area of concern was the superior portion of her left labia majora appears to be an excoriated area very suspicious for herpes simplex genitalia. A herpes culture was obtained.  Review of her labs indicated that July of this year she had a negative GC and Chlamydia culture as well as in February of this year. She has not had an HIV, RPR, hepatitis B or C.  Assessment/plan: #1 highly suspicious of vulvar HSV patient will be prescribed acyclovir 400 mg one by mouth 3 times a day for 7 days #2 patient will complete her STD screening by stopping by the lab in having an HIV, RPR, hepatitis B and C blood tests drawn today. #3 for dysmenorrhea and menorrhagia we discussed of starting her on 20 g oral contraceptive pill to take continuously and withdrawal every 3 months. Patient is a nonsmoker. Patient many years ago had been  on oral contraceptive pills and denies any family history or personal history of any clotting disorders. #4 as part of her weight management she was encouraged to continue her exercise and eating healthy and she will be started on Contrave which is an appetite suppressive medication. The risks benefits and pros and cons were discussed. Will keep the patient medication for 6 months. She will return to the office in one month for follow-up.

## 2014-04-16 NOTE — Patient Instructions (Signed)
Valacyclovir caplets What is this medicine? VALACYCLOVIR (val ay SYE kloe veer) is an antiviral medicine. It is used to treat or prevent infections caused by certain kinds of viruses. Examples of these infections include herpes and shingles. This medicine will not cure herpes. This medicine may be used for other purposes; ask your health care provider or pharmacist if you have questions. COMMON BRAND NAME(S): Valtrex What should I tell my health care provider before I take this medicine? They need to know if you have any of these conditions: -acquired immunodeficiency syndrome (AIDS) -any other condition that may weaken the immune system -bone marrow or kidney transplant -kidney disease -an unusual or allergic reaction to valacyclovir, acyclovir, ganciclovir, valganciclovir, other medicines, foods, dyes, or preservatives -pregnant or trying to get pregnant -breast-feeding How should I use this medicine? Take this medicine by mouth with a glass of water. Follow the directions on the prescription label. You can take this medicine with or without food. Take your doses at regular intervals. Do not take your medicine more often than directed. Finish the full course prescribed by your doctor or health care professional even if you think your condition is better. Do not stop taking except on the advice of your doctor or health care professional. Talk to your pediatrician regarding the use of this medicine in children. While this drug may be prescribed for children as young as 2 years for selected conditions, precautions do apply. Overdosage: If you think you have taken too much of this medicine contact a poison control center or emergency room at once. NOTE: This medicine is only for you. Do not share this medicine with others. What if I miss a dose? If you miss a dose, take it as soon as you can. If it is almost time for your next dose, take only that dose. Do not take double or extra doses. What may  interact with this medicine? -cimetidine -probenecid This list may not describe all possible interactions. Give your health care provider a list of all the medicines, herbs, non-prescription drugs, or dietary supplements you use. Also tell them if you smoke, drink alcohol, or use illegal drugs. Some items may interact with your medicine. What should I watch for while using this medicine? Tell your doctor or health care professional if your symptoms do not start to get better after 1 week. This medicine works best when taken early in the course of an infection, within the first 59 hours. Begin treatment as soon as possible after the first signs of infection like tingling, itching, or pain in the affected area. It is possible that genital herpes may still be spread even when you are not having symptoms. Always use safer sex practices like condoms made of latex or polyurethane whenever you have sexual contact. You should stay well hydrated while taking this medicine. Drink plenty of fluids. What side effects may I notice from receiving this medicine? Side effects that you should report to your doctor or health care professional as soon as possible: -allergic reactions like skin rash, itching or hives, swelling of the face, lips, or tongue -aggressive behavior -confusion -hallucinations -problems with balance, talking, walking -stomach pain -tremor -trouble passing urine or change in the amount of urine Side effects that usually do not require medical attention (report to your doctor or health care professional if they continue or are bothersome): -dizziness -headache -nausea, vomiting This list may not describe all possible side effects. Call your doctor for medical advice about side effects. You  may report side effects to FDA at 1-800-FDA-1088. Where should I keep my medicine? Keep out of the reach of children. Store at room temperature between 15 and 25 degrees C (59 and 77 degrees F). Keep  container tightly closed. Throw away any unused medicine after the expiration date. NOTE: This sheet is a summary. It may not cover all possible information. If you have questions about this medicine, talk to your doctor, pharmacist, or health care provider.  2015, Elsevier/Gold Standard. (2012-04-11 16:34:05) Bupropion; Naltrexone extended-release tablets What is this medicine? BUPROPION; NALTREXONE (byoo PROE pee on; nal TREX one) is a combination product used to promote and maintain weight loss in obese adults or overweight adults who also have weight related medical problems. This medicine should be used with a reduced calorie diet and increased physical activity. This medicine may be used for other purposes; ask your health care provider or pharmacist if you have questions. COMMON BRAND NAME(S): CONTRAVE What should I tell my health care provider before I take this medicine? They need to know if you have any of these conditions: -an eating disorder, such as anorexia or bulimia -diabetes -glaucoma -head injury -heart disease -high blood pressure -history of a drug or alcohol abuse problem -history of a tumor or infection of your brain or spine -history of stroke -history of irregular heartbeat -kidney disease -liver disease -mental illness such as bipolar disorder or psychosis -seizures -suicidal thoughts, plans, or attempt; a previous suicide attempt by you or a family member -an unusual or allergic reaction to bupropion, naltrexone, other medicines, foods, dyes, or preservatives breast-feeding -pregnant or trying to become pregnant How should I use this medicine? Take this medicine by mouth with a glass of water. Follow the directions on the prescription label. Take this medicine in the morning and in the evenings as directed by your healthcare professional. Dennis Bast can take it with or without food. Do not take with high-fat meals as this may increase your risk of seizures. Do not  crush, chew, or cut these tablets. Do not take your medicine more often than directed. Do not stop taking this medicine suddenly except upon the advice of your doctor. A special MedGuide will be given to you by the pharmacist with each prescription and refill. Be sure to read this information carefully each time. Talk to your pediatrician regarding the use of this medicine in children. Special care may be needed. Overdosage: If you think you've taken too much of this medicine contact a poison control center or emergency room at once. Overdosage: If you think you have taken too much of this medicine contact a poison control center or emergency room at once. NOTE: This medicine is only for you. Do not share this medicine with others. What if I miss a dose? If you miss a dose, skip the missed dose and take your next tablet at the regular time. Do not take double or extra doses. What may interact with this medicine? Do not take this medicine with any of the following medications: -any prescription or street opioid drug like codiene, heroin, methadone -linezolid -MAOIs like Carbex, Eldepryl, Marplan, Nardil, and Parnate -methylene blue (injected into a vein) -other medicines that contain bupropion like Zyban or Wellbutrin This medicine may also interact with the following medications: -alcohol -certain medicines for anxiety or sleep -certain medicines for blood pressure like metoprolol, propranolol -certain medicines for depression or psychotic disturbances -certain medicines for HIV or AIDS like efavirenz, lopinavir, nelfinavir, ritonavir -certain medicines for irregular heart  beat like propafenone, flecainide -certain medicines for Parkinson's disease like amantadine, levodopa -certain medicines for seizures like carbamazepine, phenytoin, phenobarbital -cimetidine -clopidogrel -cyclophosphamide -disulfiram -furazolidone -isoniazid -nicotine -orphenadrine -procarbazine -steroid medicines  like prednisone or cortisone -stimulant medicines for attention disorders, weight loss, or to stay awake -tamoxifen -theophylline -thioridazine -thiotepa -ticlopidine -tramadol -warfarin This list may not describe all possible interactions. Give your health care provider a list of all the medicines, herbs, non-prescription drugs, or dietary supplements you use. Also tell them if you smoke, drink alcohol, or use illegal drugs. Some items may interact with your medicine. What should I watch for while using this medicine? This medicine is intended to be used in addition to a healthy diet and appropriate exercise. The best results are achieved this way. Do not increase or in any way change your dose without consulting your doctor or health care professional. Do not take this medicine with other prescription or over-the-counter weight loss products without consulting your doctor or health care professional. Your doctor should tell you to stop taking this medicine if you do not lose a certain amount of weight within the first 12 weeks of treatment. Visit your doctor or health care professional for regular checkups. Your doctor may order blood tests or other tests to see how you are doing. This medicine may affect blood sugar levels. If you have diabetes, check with your doctor or health care professional before you change your diet or the dose of your diabetic medicine. Patients and their families should watch out for new or worsening depression or thoughts of suicide. Also watch out for sudden changes in feelings such as feeling anxious, agitated, panicky, irritable, hostile, aggressive, impulsive, severely restless, overly excited and hyperactive, or not being able to sleep. If this happens, especially at the beginning of treatment or after a change in dose, call your health care professional. Avoid alcoholic drinks while taking this medicine. Drinking large amounts of alcoholic beverages, using sleeping  or anxiety medicines, or quickly stopping the use of these agents while taking this medicine may increase your risk for a seizure. What side effects may I notice from receiving this medicine? Side effects that you should report to your doctor or health care professional as soon as possible: -allergic reactions like skin rash, itching or hives, swelling of the face, lips, or tongue -breathing problems -changes in vision, hearing -chest pain -confusion -dark urine -depressed mood -fast or irregular heart beat -fever -hallucination, loss of contact with reality -increased blood pressure -light-colored stools -redness, blistering, peeling or loosening of the skin, including inside the mouth -right upper belly pain -seizures -suicidal thoughts or other mood changes -unusually weak or tired -vomiting -yellowing of the eyes or skin Side effects that usually do not require medical attention (Report these to your doctor or health care professional if they continue or are bothersome.): -constipation -diarrhea -dizziness -dry mouth -headache -nausea -trouble sleeping This list may not describe all possible side effects. Call your doctor for medical advice about side effects. You may report side effects to FDA at 1-800-FDA-1088. Where should I keep my medicine? Keep out of the reach of children. Store at room temperature between 15 and 30 degrees C (59 and 86 degrees F). Throw away any unused medicine after the expiration date. NOTE: This sheet is a summary. It may not cover all possible information. If you have questions about this medicine, talk to your doctor, pharmacist, or health care provider.  2015, Elsevier/Gold Standard. (2013-01-31 15:17:29) Herpes Simplex Herpes simplex  is generally classified as Type 1 or Type 2. Type 1 is generally the type that is responsible for cold sores. Type 2 is generally associated with sexually transmitted diseases. We now know that most of the  thoughts on these viruses are inaccurate. We find that HSV1 is also present genitally and HSV2 can be present orally, but this will vary in different locations of the world. Herpes simplex is usually detected by doing a culture. Blood tests are also available for this virus; however, the accuracy is often not as good.  PREPARATION FOR TEST No preparation or fasting is necessary. NORMAL FINDINGS  No virus present  No HSV antigens or antibodies present Ranges for normal findings may vary among different laboratories and hospitals. You should always check with your doctor after having lab work or other tests done to discuss the meaning of your test results and whether your values are considered within normal limits. MEANING OF TEST  Your caregiver will go over the test results with you and discuss the importance and meaning of your results, as well as treatment options and the need for additional tests if necessary. OBTAINING THE TEST RESULTS  It is your responsibility to obtain your test results. Ask the lab or department performing the test when and how you will get your results. Document Released: 05/29/2004 Document Revised: 07/19/2011 Document Reviewed: 04/06/2008 [facility] Patient Information 2015 [place], [place]. This information is not intended to replace advice given to you by your health care provider. Make sure you discuss any questions you have with your health care provider.

## 2014-04-17 ENCOUNTER — Telehealth: Payer: Self-pay | Admitting: *Deleted

## 2014-04-17 LAB — RPR

## 2014-04-17 LAB — HEPATITIS C ANTIBODY: HCV AB: NEGATIVE

## 2014-04-17 LAB — HIV ANTIBODY (ROUTINE TESTING W REFLEX): HIV 1&2 Ab, 4th Generation: NONREACTIVE

## 2014-04-17 LAB — HEPATITIS B SURFACE ANTIGEN: Hepatitis B Surface Ag: NEGATIVE

## 2014-04-17 MED ORDER — NALTREXONE-BUPROPION HCL ER 8-90 MG PO TB12: ORAL_TABLET | ORAL | Status: DC

## 2014-04-17 MED ORDER — ACYCLOVIR 400 MG PO TABS: 400.0000 mg | ORAL_TABLET | Freq: Three times a day (TID) | ORAL | Status: DC

## 2014-04-17 NOTE — Telephone Encounter (Signed)
Pt was seen for OV yesterday and Rx for valtrex was not there. Rx for acyclovir 400 mg one by mouth 3 times a day for 7 days will be sent. Medication for contrave was not sent as well because it was set on print. I called this Rx in as well, left message on pt voicemail this had been done

## 2014-04-18 LAB — HERPES SIMPLEX VIRUS CULTURE: ORGANISM ID, BACTERIA: NOT DETECTED

## 2014-04-29 ENCOUNTER — Telehealth: Payer: Self-pay | Admitting: *Deleted

## 2014-04-29 NOTE — Telephone Encounter (Signed)
Call in prescription phentermine 37.5 mg 1 by mouth daily #30 with 2 refills. I will need to see her in one month for follow-up.

## 2014-04-29 NOTE — Telephone Encounter (Signed)
Pt was prescribed contrave on OV 04/16/14 said medication is too expensive, asked if Rx for phentermine 37.5mg  could be sent in pharmacy again? Please advise

## 2014-04-30 MED ORDER — PHENTERMINE HCL 37.5 MG PO CAPS: 37.5000 mg | ORAL_CAPSULE | ORAL | Status: AC

## 2014-04-30 NOTE — Telephone Encounter (Signed)
Rx called in, Left the below on voicemail for follow up in 1 month.

## 2014-05-09 IMAGING — CT CT ABD-PELV W/ CM
1 of 2 series · 15 of 32 positions shown, 19 images · IV contrast (OMNIPAQUE 300)
Comparison: Radiographs 08/30/2012.

CLINICAL DATA: Lower abdominal pain.

CT ABDOMEN AND PELVIS WITH CONTRAST
TECHNIQUE: Multidetector CT imaging of the abdomen and pelvis was
performed following the standard protocol during bolus
administration of intravenous contrast.
Contrast: 50mL OMNIPAQUE IOHEXOL 300 MG/ML  SOLN, 100mL OMNIPAQUE
IOHEXOL 300 MG/ML  SOLN

[Series 3: abd/pel with · axial · 0.74mm/px · z∈[-371,+49]mm · 15 of 92 slices shown, 19 images]
[im 4/92  soft-tissue]
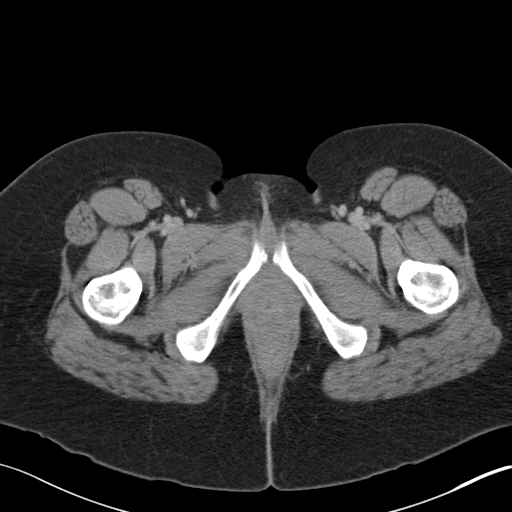
[im 4/92  bone]
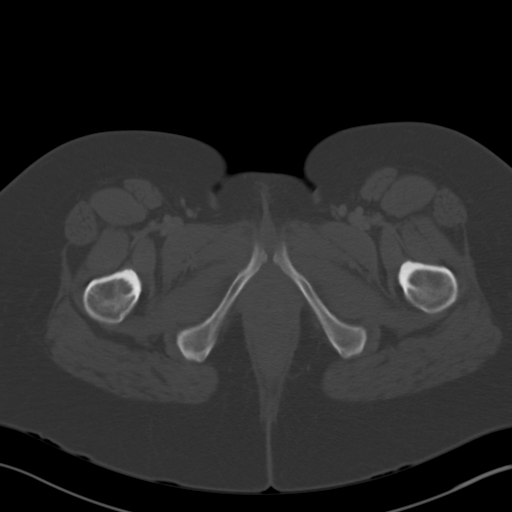
[im 11/92  soft-tissue]
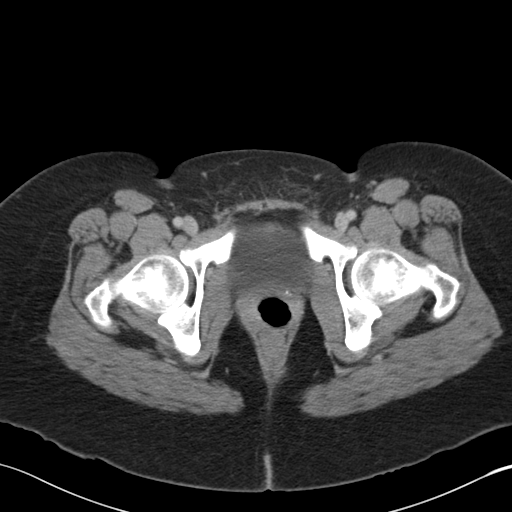
[im 18/92  soft-tissue]
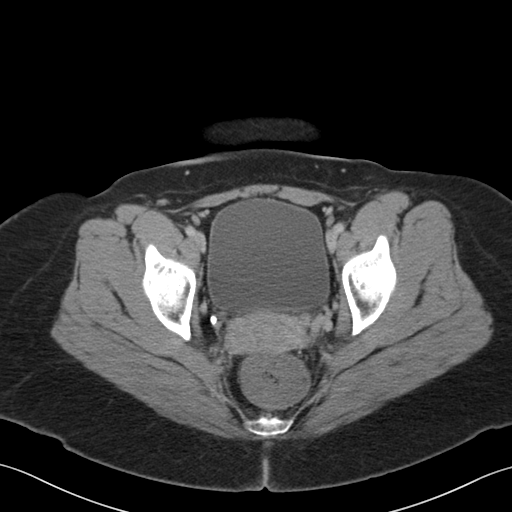
[im 25/92  soft-tissue]
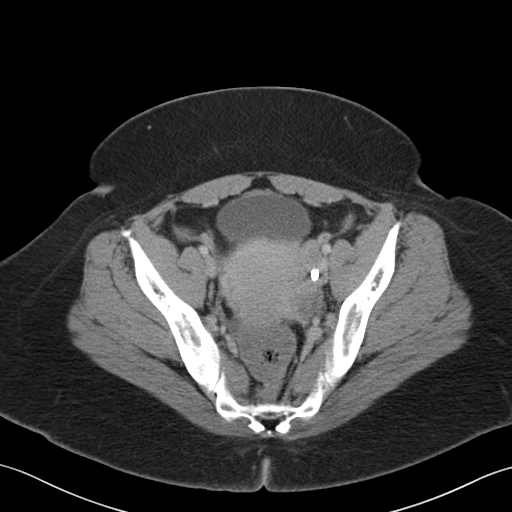
[im 32/92  soft-tissue]
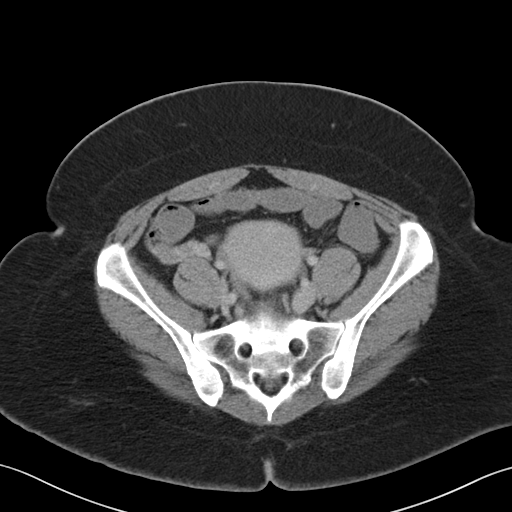
[im 39/92  soft-tissue]
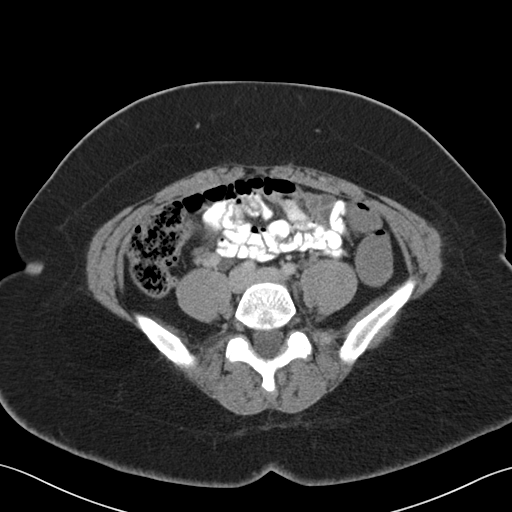
[im 46/92  soft-tissue]
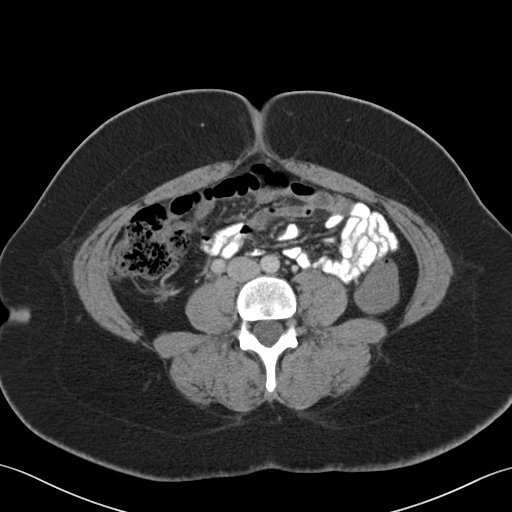
[im 53/92  soft-tissue]
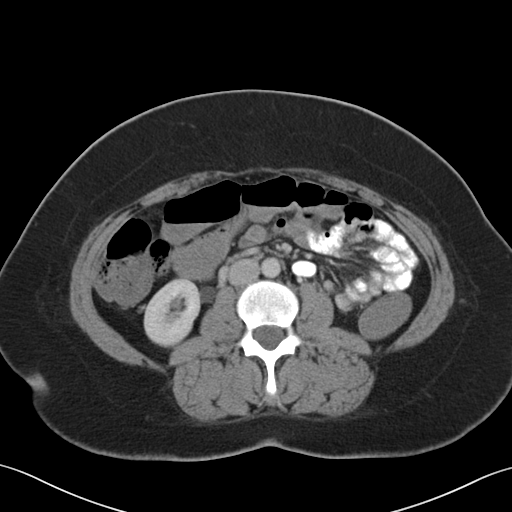
[im 60/92  soft-tissue]
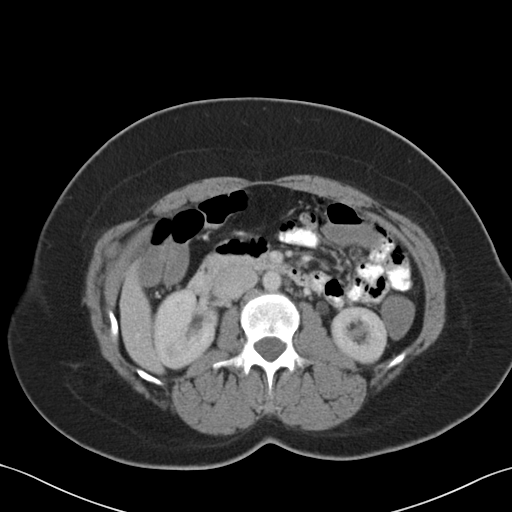
[im 60/92  bone]
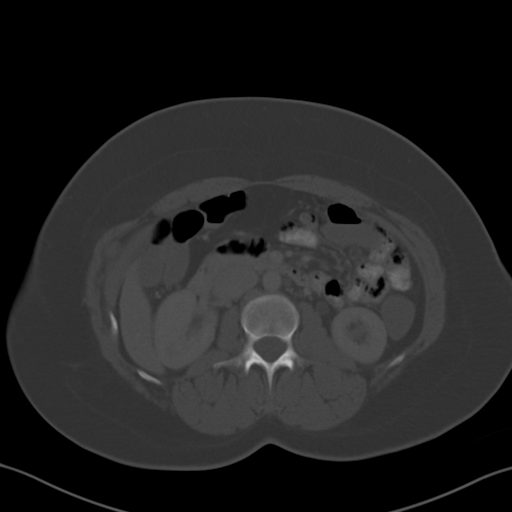
[im 67/92  soft-tissue]
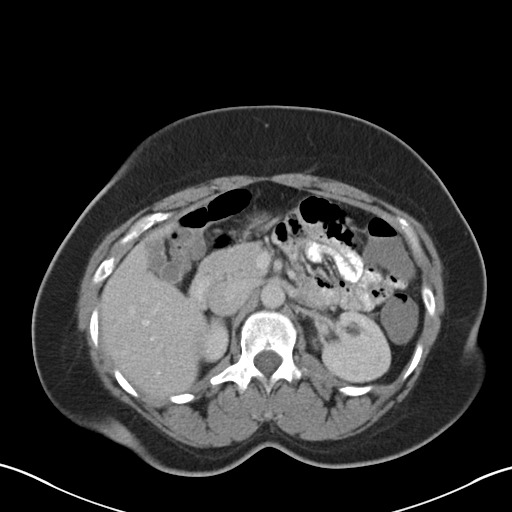
[im 74/92  soft-tissue]
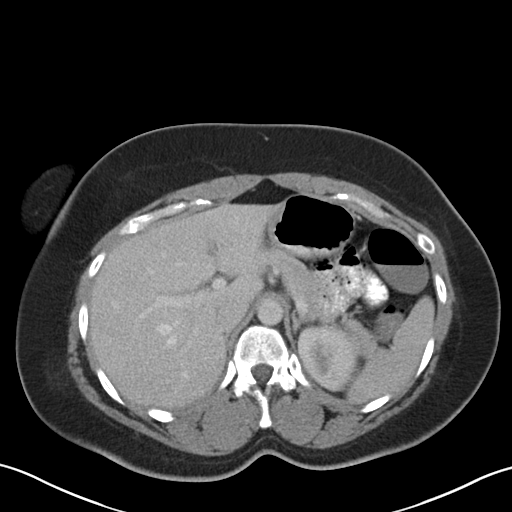
[im 78/92  lung]
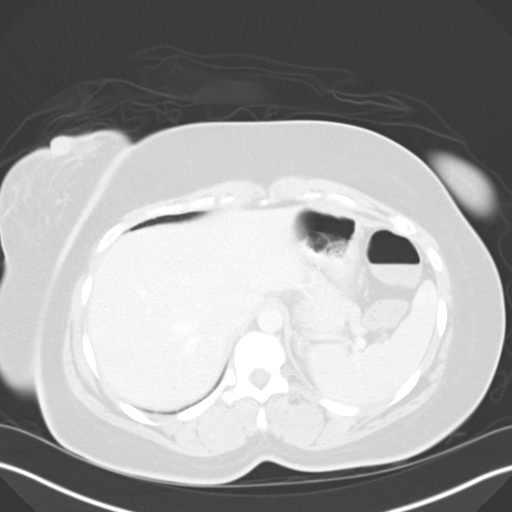
[im 81/92  soft-tissue]
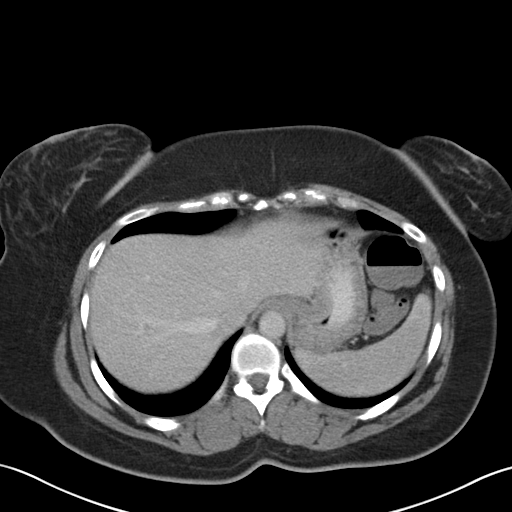
[im 81/92  lung]
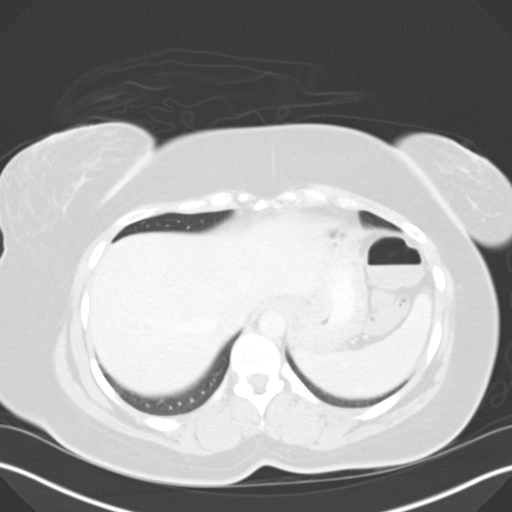
[im 85/92  lung]
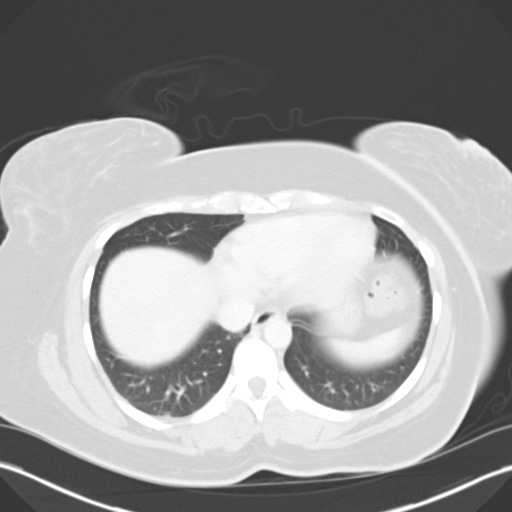
[im 88/92  soft-tissue]
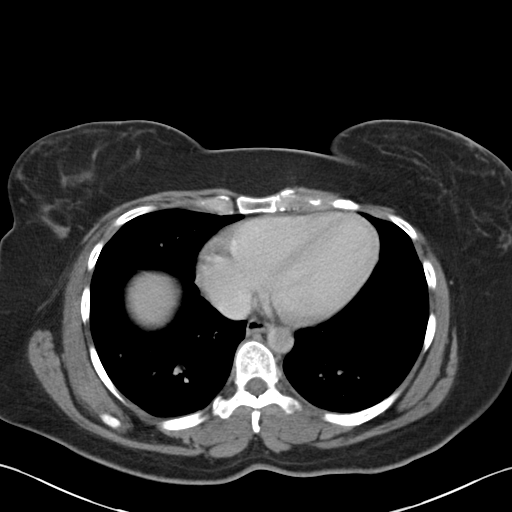
[im 88/92  lung]
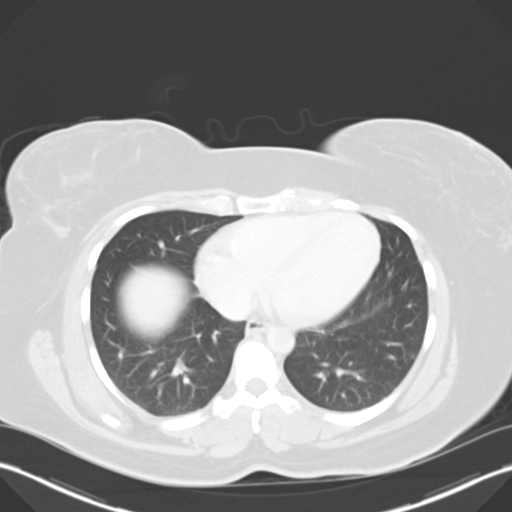

[15 of 32 positions shown; findings below may reference images not displayed]

FINDINGS: The lung bases are clear.  No pleural effusion or
pulmonary nodule.  The heart is within normal limits in size.  No
pericardial effusion.  The distal esophagus is unremarkable.

The liver is unremarkable.  No focal hepatic lesions or
intrahepatic biliary dilatation.  The gallbladder is normal.  No
common bile duct dilatation.  The pancreas is normal.  The spleen
is normal.  The adrenal glands are normal.

There is mild to moderate bilateral hydroureteronephrosis without
obvious cause.  No obstructing ureteral calculi.  No bladder
calculi.  No compressive mass or adenopathy.  The bladder is mildly
distended.

No mesenteric or retroperitoneal mass or adenopathy.  The aorta is
normal.  The branch vessels are normal.

The stomach, duodenum, small bowel and colon are unremarkable.  No
inflammatory changes or mass lesions.  The appendix is normal.

The uterus and ovaries are normal.  No pelvic mass, adenopathy or
free pelvic fluid collections.  No inguinal mass or hernia. There
is a tubal ligation clip noted near the left ovary..  The right
tubal ligation clip is noted in the lower right abdomen near the
inferior tip of the liver.

The bony structures are unremarkable.
IMPRESSION: 1.  No acute abdominal/pelvic findings, mass lesions or adenopathy.
2.  Bilateral hydroureteronephrosis and mildly dilated bladder.  No
ureteral obstruction is identified.
3.  Right tubal ligation clip has migrated into the lower right
abdomen.  It is in the paracolic gutter along the inferior tip of
the liver.

## 2014-06-06 ENCOUNTER — Ambulatory Visit (INDEPENDENT_AMBULATORY_CARE_PROVIDER_SITE_OTHER): Payer: No Typology Code available for payment source | Admitting: Gynecology

## 2014-06-06 ENCOUNTER — Encounter: Payer: Self-pay | Admitting: Gynecology

## 2014-06-06 VITALS — BP 124/78 | Wt 209.0 lb

## 2014-06-06 DIAGNOSIS — E663 Overweight: Secondary | ICD-10-CM

## 2014-06-06 DIAGNOSIS — N920 Excessive and frequent menstruation with regular cycle: Secondary | ICD-10-CM

## 2014-06-06 DIAGNOSIS — N946 Dysmenorrhea, unspecified: Secondary | ICD-10-CM

## 2014-06-06 NOTE — Progress Notes (Signed)
   Patient presented to the office today after one month initiation of phentermine 37.5 mg daily for appetite suppression to help her lose weight. She is tolerating the medication well. She is exercising regularly at least twice a week and eating healthy. Last year she was started on the same medication but took it very infrequently and very irregular and never took it for 3 months straight. Review of her record indicated that in March 2015 she was weighing 226 pounds and today she was weighing 209 pounds with a blood pressure 120/78 and pulse of 88 respirations 12.  Exam: HEENT unremarkable Neck supple trachea midline no cry bruits no thyromegaly Lungs: Clear to auscultation Rogers or wheezes Heart: Regular rate and rhythm no murmurs or gallops  Patient last office visit had a full STD screening which was negative. HSV culture was negative as well. Patient same-sex relationship. Patient had been started on 20 g oral contraceptive pill for dysmenorrhea and menorrhagia and has done well.  Assessment/plan: Patient doing well on phentermine 37.5 mg daily. Patient will return back in 2 months for follow-up before completion of treatment. Nutritionist consult it was recommended patient states she's doing well without it.

## 2014-08-12 ENCOUNTER — Encounter: Payer: Self-pay | Admitting: Gynecology

## 2014-08-12 ENCOUNTER — Ambulatory Visit (INDEPENDENT_AMBULATORY_CARE_PROVIDER_SITE_OTHER): Payer: No Typology Code available for payment source | Admitting: Gynecology

## 2014-08-12 VITALS — BP 116/66 | Wt 208.0 lb

## 2014-08-12 DIAGNOSIS — Z308 Encounter for other contraceptive management: Secondary | ICD-10-CM

## 2014-08-12 DIAGNOSIS — Z7689 Persons encountering health services in other specified circumstances: Secondary | ICD-10-CM

## 2014-08-12 DIAGNOSIS — K5900 Constipation, unspecified: Secondary | ICD-10-CM | POA: Diagnosis not present

## 2014-08-12 DIAGNOSIS — F17200 Nicotine dependence, unspecified, uncomplicated: Secondary | ICD-10-CM

## 2014-08-12 DIAGNOSIS — R635 Abnormal weight gain: Secondary | ICD-10-CM | POA: Diagnosis not present

## 2014-08-12 DIAGNOSIS — Z720 Tobacco use: Secondary | ICD-10-CM | POA: Diagnosis not present

## 2014-08-12 MED ORDER — LINACLOTIDE 145 MCG PO CAPS: 145.0000 ug | ORAL_CAPSULE | Freq: Every day | ORAL | Status: AC

## 2014-08-12 NOTE — Progress Notes (Signed)
   Patient presented to the office today for follow-up since she was started on phentermine 37.5 mg daily because of her being overweight as an appetite suppressant.she is tolerating the medications well but sometimes forgets to take it. She is in the process of making arrangements to have a personal trainer to work with her to exercise. We are going to refer her also to a nutritionist as well. Last year she was started on the same medication but took it very infrequently and very irregular and never took it for 3 months straight. Review of her record indicated that in March 2015 she was weighing 226 pounds and at last office visit on January 28 she had dropped to 209 pounds.she was weighing 280 pounds today.  Exam: Blood pressure 116/66  Weight 208 pounds   BMI 33.57 kg/m Gen. Appearance well-developed well-nourished female overweight  Exam: HEENT unremarkable Neck supple trachea midline no cry bruits no thyromegaly Lungs: Clear to auscultation Rogers or wheezes Heart: Regular rate and rhythm no murmurs or gallops  Assessment/plan: Patient overweight started phentermine approximate 2-1/2 months ago. Prescription refill for 3 more months. We will discuss the risks benefits and pros and cons to include pulmonary hypertension and cardiac valvular disease. Patient would like to go 3 more months and promises to adhere to take the medication on daily basis. She also is been suffering from constipation. She had been placed on a 20 g oral contraceptive pill for menstrual regulation has been off of it for several months and reports normal menstrual cycles. She is in a same-sex relationship. She is going to be referred also to a nutritionist as well. She is going to be prescribed Linzess 145 mcg daily.she is going to return back in 3 months for her annual exam and reassessment. She was counseled as to the detrimental effects of smoking she has smoked cigars occasionally.

## 2014-08-23 ENCOUNTER — Telehealth: Payer: Self-pay | Admitting: *Deleted

## 2014-08-23 MED ORDER — LACTULOSE 10 GM/15ML PO SOLN: 10.0000 g | Freq: Every day | ORAL | Status: AC | PRN

## 2014-08-23 NOTE — Telephone Encounter (Signed)
(  pt aware you are out of the office) Pt called stating the Rx for Linzess 145 mcg is too expensive, nancy gave pt a Rx for Lactulose 10mg /15mg  solution which worked well for her she asked if a refill on this could be given? Please advise

## 2014-08-23 NOTE — Telephone Encounter (Signed)
Ok to call in previous prescription from [place]

## 2014-08-23 NOTE — Telephone Encounter (Signed)
Rx sent, pt informed. 

## 2014-09-20 ENCOUNTER — Ambulatory Visit (INDEPENDENT_AMBULATORY_CARE_PROVIDER_SITE_OTHER): Payer: No Typology Code available for payment source | Admitting: Gynecology

## 2014-09-20 ENCOUNTER — Encounter: Payer: Self-pay | Admitting: Gynecology

## 2014-09-20 ENCOUNTER — Other Ambulatory Visit: Payer: Self-pay | Admitting: Gynecology

## 2014-09-20 VITALS — BP 118/70 | Wt 202.0 lb

## 2014-09-20 DIAGNOSIS — N760 Acute vaginitis: Secondary | ICD-10-CM

## 2014-09-20 DIAGNOSIS — Z113 Encounter for screening for infections with a predominantly sexual mode of transmission: Secondary | ICD-10-CM | POA: Diagnosis not present

## 2014-09-20 DIAGNOSIS — N898 Other specified noninflammatory disorders of vagina: Secondary | ICD-10-CM | POA: Diagnosis not present

## 2014-09-20 DIAGNOSIS — R635 Abnormal weight gain: Secondary | ICD-10-CM | POA: Diagnosis not present

## 2014-09-20 DIAGNOSIS — A499 Bacterial infection, unspecified: Secondary | ICD-10-CM | POA: Diagnosis not present

## 2014-09-20 DIAGNOSIS — B9689 Other specified bacterial agents as the cause of diseases classified elsewhere: Secondary | ICD-10-CM

## 2014-09-20 LAB — WET PREP FOR TRICH, YEAST, CLUE
Trich, Wet Prep: NONE SEEN
Yeast Wet Prep HPF POC: NONE SEEN

## 2014-09-20 MED ORDER — TINIDAZOLE 500 MG PO TABS: ORAL_TABLET | ORAL | Status: DC

## 2014-09-20 MED ORDER — TINIDAZOLE 500 MG PO TABS: 500.0000 mg | ORAL_TABLET | Freq: Once | ORAL | Status: DC

## 2014-09-20 NOTE — Addendum Note (Signed)
Addended by: Thurnell Garbe A on: 09/20/2014 12:40 PM   Modules accepted: Orders

## 2014-09-20 NOTE — Progress Notes (Signed)
   39 year old presented to the office today complaining one-week history of any vaginal discharge. She had some over-the-counter medication that she use intravaginally for 1 day continues to have symptoms. She is in a same-sex relationship. She's currently on phentermine 37.5 mg daily for weight reduction and has tolerated the medication well. She was weighing 226 pounds and is now down to 202 pounds. Patient denies any dysuria, back pain, fever, nausea, or vomiting.  Exam: Blood pressure 118/70  weight 202 pounds Pelvic exam: Bartholin urethra Skene was within normal limits Vagina: Clear for fishy odor Cervix: No lesions or discharge Bimanual exam: Not done Rectal exam: Not done  Wet prep positive amine, moderate clue cells, moderate WBC, too numerous to count bacteria  Assessment/plan: Clinical evidence of bacterial vaginosis will be treated with Tindamax 500 mg 4 tablets today and repeat in 24 hours. A GC and Chlamydia culture was obtained today result pending at time of this dictation.

## 2014-09-20 NOTE — Patient Instructions (Addendum)
Bacterial Vaginosis Bacterial vaginosis is a vaginal infection that occurs when the normal balance of bacteria in the vagina is disrupted. It results from an overgrowth of certain bacteria. This is the most common vaginal infection in women of childbearing age. Treatment is important to prevent complications, especially in pregnant women, as it can cause a premature delivery. CAUSES  Bacterial vaginosis is caused by an increase in harmful bacteria that are normally present in smaller amounts in the vagina. Several different kinds of bacteria can cause bacterial vaginosis. However, the reason that the condition develops is not fully understood. RISK FACTORS Certain activities or behaviors can put you at an increased risk of developing bacterial vaginosis, including:  Having a new sex partner or multiple sex partners.  Douching.  Using an intrauterine device (IUD) for contraception. Women do not get bacterial vaginosis from toilet seats, bedding, swimming pools, or contact with objects around them. SIGNS AND SYMPTOMS  Some women with bacterial vaginosis have no signs or symptoms. Common symptoms include:  Grey vaginal discharge.  A fishlike odor with discharge, especially after sexual intercourse.  Itching or burning of the vagina and vulva.  Burning or pain with urination. DIAGNOSIS  Your health care provider will take a medical history and examine the vagina for signs of bacterial vaginosis. A sample of vaginal fluid may be taken. Your health care provider will look at this sample under a microscope to check for bacteria and abnormal cells. A vaginal pH test may also be done.  TREATMENT  Bacterial vaginosis may be treated with antibiotic medicines. These may be given in the form of a pill or a vaginal cream. A second round of antibiotics may be prescribed if the condition comes back after treatment.  HOME CARE INSTRUCTIONS   Only take over-the-counter or prescription medicines as  directed by your health care provider.  If antibiotic medicine was prescribed, take it as directed. Make sure you finish it even if you start to feel better.  Do not have sex until treatment is completed.  Tell all sexual partners that you have a vaginal infection. They should see their health care provider and be treated if they have problems, such as a mild rash or itching.  Practice safe sex by using condoms and only having one sex partner. SEEK MEDICAL CARE IF:   Your symptoms are not improving after 3 days of treatment.  You have increased discharge or pain.  You have a fever. MAKE SURE YOU:   Understand these instructions.  Will watch your condition.  Will get help right away if you are not doing well or get worse. FOR MORE INFORMATION  Centers for Disease Control and Prevention, Division of STD Prevention: [employer]raiser[employer]raud[employer] American Sexual Health Association (ASHA): www.ashastd.org  Document Released: 04/26/2005 Document Revised: 02/14/2013 Document Reviewed: 12/06/2012 [facility] Patient Information 2015 [place], [place]. This information is not intended to replace advice given to you by your health care provider. Make sure you discuss any questions you have with your health care provider.  Tinidazole tablets What is this medicine? TINIDAZOLE (tye NI da zole) is an antiinfective. It is used to treat amebiasis, giardiasis, trichomoniasis, and vaginosis. It will not work for colds, flu, or other viral infections. This medicine may be used for other purposes; ask your health care provider or pharmacist if you have questions. COMMON BRAND NAME(S): Tindamax What should I tell my health care provider before I take this medicine? They need to know if you have any of these conditions: -anemia or  other blood disorders -if you frequently drink alcohol containing drinks -receiving hemodialysis -seizure disorder -an unusual or allergic reaction to tinidazole, other medicines, foods,  dyes, or preservatives -pregnant or trying to get pregnant -breast-feeding How should I use this medicine? Take this medicine by mouth with a full glass of water. Follow the directions on the prescription label. Take with food. Take your medicine at regular intervals. Do not take your medicine more often than directed. Take all of your medicine as directed even if you think you are better. Do not skip doses or stop your medicine early. Talk to your pediatrician regarding the use of this medicine in children. While this drug may be prescribed for children as young as 12 years of age for selected conditions, precautions do apply. Overdosage: If you think you have taken too much of this medicine contact a poison control center or emergency room at once. NOTE: This medicine is only for you. Do not share this medicine with others. What if I miss a dose? If you miss a dose, take it as soon as you can. If it is almost time for your next dose, take only that dose. Do not take double or extra doses. What may interact with this medicine? Do not take this medicine with any of the following medications: -alcohol or any product that contains alcohol -amprenavir oral solution -disulfiram -paclitaxel injection -ritonavir oral solution -sertraline oral solution -sulfamethoxazole-trimethoprim injection This medicine may also interact with the following medications: -cholestyramine -cimetidine -conivaptan -cyclosporin -fluorouracil -fosphenytoin, phenytoin -ketoconazole -lithium -phenobarbital -tacrolimus -warfarin This list may not describe all possible interactions. Give your health care provider a list of all the medicines, herbs, non-prescription drugs, or dietary supplements you use. Also tell them if you smoke, drink alcohol, or use illegal drugs. Some items may interact with your medicine. What should I watch for while using this medicine? Tell your doctor or health care professional if your  symptoms do not improve or if they get worse. Avoid alcoholic drinks while you are taking this medicine and for three days afterward. Alcohol may make you feel dizzy, sick, or flushed. If you are being treated for a sexually transmitted disease, avoid sexual contact until you have finished your treatment. Your sexual partner may also need treatment. What side effects may I notice from receiving this medicine? Side effects that you should report to your doctor or health care professional as soon as possible: -allergic reactions like skin rash, itching or hives, swelling of the face, lips, or tongue -breathing problems -confusion, depression -dark or white patches in the mouth -feeling faint or lightheaded, falls -fever, infection -numbness, tingling, pain or weakness in the hands or feet -pain when passing urine -seizures -unusually weak or tired -vaginal irritation or discharge -vomiting Side effects that usually do not require medical attention (report to your doctor or health care professional if they continue or are bothersome): -dark brown or reddish urine -diarrhea -headache -loss of appetite -metallic taste -nausea -stomach upset This list may not describe all possible side effects. Call your doctor for medical advice about side effects. You may report side effects to FDA at 1-800-FDA-1088. Where should I keep my medicine? Keep out of the reach of children. Store at room temperature between 15 and 30 degrees C (59 and 86 degrees F). Protect from light and moisture. Keep container tightly closed. Throw away any unused medicine after the expiration date. NOTE: This sheet is a summary. It may not cover all possible information. If you have  questions about this medicine, talk to your doctor, pharmacist, or health care provider.  2015, [employer] Standard. (2008-01-22 15:22:28) Bacterial Vaginosis Bacterial vaginosis is a vaginal infection that occurs when the normal balance of  bacteria in the vagina is disrupted. It results from an overgrowth of certain bacteria. This is the most common vaginal infection in women of childbearing age. Treatment is important to prevent complications, especially in pregnant women, as it can cause a premature delivery. CAUSES  Bacterial vaginosis is caused by an increase in harmful bacteria that are normally present in smaller amounts in the vagina. Several different kinds of bacteria can cause bacterial vaginosis. However, the reason that the condition develops is not fully understood. RISK FACTORS Certain activities or behaviors can put you at an increased risk of developing bacterial vaginosis, including:  Having a new sex partner or multiple sex partners.  Douching.  Using an intrauterine device (IUD) for contraception. Women do not get bacterial vaginosis from toilet seats, bedding, swimming pools, or contact with objects around them. SIGNS AND SYMPTOMS  Some women with bacterial vaginosis have no signs or symptoms. Common symptoms include:  Grey vaginal discharge.  A fishlike odor with discharge, especially after sexual intercourse.  Itching or burning of the vagina and vulva.  Burning or pain with urination. DIAGNOSIS  Your health care provider will take a medical history and examine the vagina for signs of bacterial vaginosis. A sample of vaginal fluid may be taken. Your health care provider will look at this sample under a microscope to check for bacteria and abnormal cells. A vaginal pH test may also be done.  TREATMENT  Bacterial vaginosis may be treated with antibiotic medicines. These may be given in the form of a pill or a vaginal cream. A second round of antibiotics may be prescribed if the condition comes back after treatment.  HOME CARE INSTRUCTIONS   Only take over-the-counter or prescription medicines as directed by your health care provider.  If antibiotic medicine was prescribed, take it as directed. Make  sure you finish it even if you start to feel better.  Do not have sex until treatment is completed.  Tell all sexual partners that you have a vaginal infection. They should see their health care provider and be treated if they have problems, such as a mild rash or itching.  Practice safe sex by using condoms and only having one sex partner. SEEK MEDICAL CARE IF:   Your symptoms are not improving after 3 days of treatment.  You have increased discharge or pain.  You have a fever. MAKE SURE YOU:   Understand these instructions.  Will watch your condition.  Will get help right away if you are not doing well or get worse. FOR MORE INFORMATION  Centers for Disease Control and Prevention, Division of STD Prevention: [employer]raiser[employer]raud[employer]fi American Sexual Health Association (ASHA): www.ashastd.org  Document Released: 04/26/2005 Document Revised: 02/14/2013 Document Reviewed: 12/06/2012 [facility] Patient Information 2015 [place], [place]. This information is not intended to replace advice given to you by your health care provider. Make sure you discuss any questions you have with your health care provider.

## 2014-09-21 LAB — GC/CHLAMYDIA PROBE AMP
CT Probe RNA: NEGATIVE
GC PROBE AMP APTIMA: NEGATIVE

## 2014-09-26 ENCOUNTER — Telehealth: Payer: Self-pay | Admitting: *Deleted

## 2014-09-26 NOTE — Telephone Encounter (Signed)
Pt informed with negative GC and Chlamydia results.

## 2014-10-03 ENCOUNTER — Telehealth: Payer: Self-pay | Admitting: *Deleted

## 2014-10-03 MED ORDER — METRONIDAZOLE 500 MG PO TABS: 500.0000 mg | ORAL_TABLET | Freq: Two times a day (BID) | ORAL | Status: AC

## 2014-10-03 NOTE — Telephone Encounter (Signed)
Pt informed, Rx sent. 

## 2014-10-03 NOTE — Telephone Encounter (Signed)
Pt was prescribed Tindamax 500 mg 4 tablets today and repeat in 24 hours on 09/20/14 completed medication, now odor has returned. Pt asked if she could have Rx for flagyl states this works. Please advise

## 2014-10-03 NOTE — Telephone Encounter (Signed)
Okay, Flagyl 500 twice daily for 7 days #14 review of void alcohol . office visit if no relief.

## 2014-10-24 ENCOUNTER — Encounter: Payer: No Typology Code available for payment source | Admitting: Gynecology

## 2014-12-11 ENCOUNTER — Encounter: Payer: No Typology Code available for payment source | Admitting: Gynecology

## 2016-09-22 ENCOUNTER — Encounter: Payer: Self-pay | Admitting: Gynecology
